# Patient Record
Sex: Male | Born: 1970
Health system: Southern US, Community
[De-identification: ages and names within clinical notes are randomized; demographics above are authoritative.]

## PROBLEM LIST (undated history)

## (undated) DIAGNOSIS — E785 Hyperlipidemia, unspecified: Secondary | ICD-10-CM

## (undated) DIAGNOSIS — N529 Male erectile dysfunction, unspecified: Secondary | ICD-10-CM

## (undated) DIAGNOSIS — T7840XA Allergy, unspecified, initial encounter: Secondary | ICD-10-CM

## (undated) HISTORY — PX: WISDOM TOOTH EXTRACTION: SHX21

## (undated) HISTORY — DX: Allergy, unspecified, initial encounter: T78.40XA

## (undated) HISTORY — PX: TONSILLECTOMY: SHX5217

## (undated) HISTORY — DX: Male erectile dysfunction, unspecified: N52.9

## (undated) HISTORY — DX: Hyperlipidemia, unspecified: E78.5

---

## 2003-04-13 ENCOUNTER — Encounter: Payer: Self-pay | Admitting: Internal Medicine

## 2004-04-25 ENCOUNTER — Ambulatory Visit: Payer: Self-pay | Admitting: Internal Medicine

## 2005-11-06 ENCOUNTER — Ambulatory Visit: Payer: Self-pay | Admitting: Internal Medicine

## 2006-04-03 ENCOUNTER — Ambulatory Visit: Payer: Self-pay | Admitting: Internal Medicine

## 2006-04-03 LAB — CONVERTED CEMR LAB
ALT: 32 units/L (ref 0–40)
Cholesterol: 255 mg/dL (ref 0–200)
HDL: 44.3 mg/dL (ref >39.0)
Triglyceride fasting, serum: 85 mg/dL (ref 0–149)
VLDL: 17 mg/dL (ref 0–40)

## 2006-04-14 ENCOUNTER — Ambulatory Visit: Payer: Self-pay | Admitting: Internal Medicine

## 2006-10-29 ENCOUNTER — Telehealth: Payer: Self-pay | Admitting: Internal Medicine

## 2007-01-01 ENCOUNTER — Ambulatory Visit: Payer: Self-pay | Admitting: Internal Medicine

## 2007-02-16 DIAGNOSIS — Z9189 Other specified personal risk factors, not elsewhere classified: Secondary | ICD-10-CM | POA: Insufficient documentation

## 2007-03-22 ENCOUNTER — Ambulatory Visit: Payer: Self-pay | Admitting: Internal Medicine

## 2007-03-22 DIAGNOSIS — I498 Other specified cardiac arrhythmias: Secondary | ICD-10-CM | POA: Insufficient documentation

## 2007-06-15 ENCOUNTER — Ambulatory Visit: Payer: Self-pay | Admitting: Internal Medicine

## 2007-06-23 ENCOUNTER — Ambulatory Visit: Payer: Self-pay | Admitting: Internal Medicine

## 2007-06-23 DIAGNOSIS — E785 Hyperlipidemia, unspecified: Secondary | ICD-10-CM | POA: Insufficient documentation

## 2007-06-24 ENCOUNTER — Encounter (INDEPENDENT_AMBULATORY_CARE_PROVIDER_SITE_OTHER): Payer: Self-pay | Admitting: *Deleted

## 2007-06-24 ENCOUNTER — Telehealth (INDEPENDENT_AMBULATORY_CARE_PROVIDER_SITE_OTHER): Payer: Self-pay | Admitting: *Deleted

## 2007-09-27 ENCOUNTER — Ambulatory Visit: Payer: Self-pay | Admitting: Internal Medicine

## 2007-10-08 ENCOUNTER — Encounter (INDEPENDENT_AMBULATORY_CARE_PROVIDER_SITE_OTHER): Payer: Self-pay | Admitting: *Deleted

## 2007-10-08 LAB — CONVERTED CEMR LAB
AST: 24 units/L (ref 0–37)
Cholesterol: 161 mg/dL (ref 0–200)
HDL: 42.8 mg/dL (ref >39.0)
Triglycerides: 80 mg/dL (ref 0–149)
VLDL: 16 mg/dL (ref 0–40)

## 2007-10-18 ENCOUNTER — Telehealth (INDEPENDENT_AMBULATORY_CARE_PROVIDER_SITE_OTHER): Payer: Self-pay | Admitting: *Deleted

## 2008-06-05 ENCOUNTER — Ambulatory Visit: Payer: Self-pay | Admitting: Internal Medicine

## 2008-06-05 DIAGNOSIS — M25519 Pain in unspecified shoulder: Secondary | ICD-10-CM | POA: Insufficient documentation

## 2008-06-12 ENCOUNTER — Encounter: Payer: Self-pay | Admitting: Internal Medicine

## 2008-07-25 ENCOUNTER — Telehealth (INDEPENDENT_AMBULATORY_CARE_PROVIDER_SITE_OTHER): Payer: Self-pay | Admitting: *Deleted

## 2008-12-07 ENCOUNTER — Ambulatory Visit: Payer: Self-pay | Admitting: Internal Medicine

## 2008-12-10 LAB — CONVERTED CEMR LAB
HDL: 49.9 mg/dL (ref >39.00)
LDL Cholesterol: 132 mg/dL — ABNORMAL HIGH (ref 0–99)
Total CHOL/HDL Ratio: 4
Triglycerides: 71 mg/dL (ref 0.0–149.0)
VLDL: 14.2 mg/dL (ref 0.0–40.0)

## 2008-12-11 ENCOUNTER — Encounter (INDEPENDENT_AMBULATORY_CARE_PROVIDER_SITE_OTHER): Payer: Self-pay | Admitting: *Deleted

## 2008-12-12 ENCOUNTER — Ambulatory Visit: Payer: Self-pay | Admitting: Internal Medicine

## 2008-12-12 DIAGNOSIS — R739 Hyperglycemia, unspecified: Secondary | ICD-10-CM | POA: Insufficient documentation

## 2008-12-12 LAB — CONVERTED CEMR LAB: LDL Goal: 140 mg/dL

## 2009-07-03 ENCOUNTER — Telehealth (INDEPENDENT_AMBULATORY_CARE_PROVIDER_SITE_OTHER): Payer: Self-pay | Admitting: *Deleted

## 2009-09-13 ENCOUNTER — Ambulatory Visit: Payer: Self-pay | Admitting: Internal Medicine

## 2009-09-13 DIAGNOSIS — J309 Allergic rhinitis, unspecified: Secondary | ICD-10-CM | POA: Insufficient documentation

## 2009-09-13 DIAGNOSIS — N529 Male erectile dysfunction, unspecified: Secondary | ICD-10-CM | POA: Insufficient documentation

## 2009-09-13 DIAGNOSIS — F528 Other sexual dysfunction not due to a substance or known physiological condition: Secondary | ICD-10-CM | POA: Insufficient documentation

## 2009-09-20 ENCOUNTER — Telehealth (INDEPENDENT_AMBULATORY_CARE_PROVIDER_SITE_OTHER): Payer: Self-pay | Admitting: *Deleted

## 2009-09-24 ENCOUNTER — Ambulatory Visit: Payer: Self-pay | Admitting: Internal Medicine

## 2009-10-03 ENCOUNTER — Telehealth (INDEPENDENT_AMBULATORY_CARE_PROVIDER_SITE_OTHER): Payer: Self-pay | Admitting: *Deleted

## 2009-10-16 ENCOUNTER — Telehealth (INDEPENDENT_AMBULATORY_CARE_PROVIDER_SITE_OTHER): Payer: Self-pay | Admitting: *Deleted

## 2009-12-11 ENCOUNTER — Telehealth (INDEPENDENT_AMBULATORY_CARE_PROVIDER_SITE_OTHER): Payer: Self-pay | Admitting: *Deleted

## 2010-02-23 LAB — CONVERTED CEMR LAB
Sex Hormone Binding: 21 nmol/L (ref 13–71)
Testosterone Free: 62.5 pg/mL (ref 47.0–244.0)

## 2010-04-10 ENCOUNTER — Ambulatory Visit: Admit: 2010-04-10 | Payer: Self-pay | Admitting: Internal Medicine

## 2010-04-28 LAB — CONVERTED CEMR LAB
AST: 20 units/L (ref 0–37)
AST: 25 units/L (ref 0–37)
Alkaline Phosphatase: 62 units/L (ref 39–117)
Alkaline Phosphatase: 62 units/L (ref 39–117)
BUN: 19 mg/dL (ref 6–23)
Basophils Absolute: 0 10*3/uL (ref 0.0–0.1)
Bilirubin, Direct: 0.1 mg/dL (ref 0.0–0.3)
Calcium: 9.6 mg/dL (ref 8.4–10.5)
Chloride: 107 meq/L (ref 96–112)
Cholesterol, target level: 200 mg/dL
Cholesterol: 218 mg/dL (ref 0–200)
Eosinophils Absolute: 0.2 10*3/uL (ref 0.0–0.6)
GFR calc Af Amer: 121 mL/min
GFR calc non Af Amer: 100 mL/min
GFR calc non Af Amer: 93.9 mL/min (ref >60)
Glucose, Bld: 82 mg/dL (ref 70–99)
HCT: 42.2 % (ref 39.0–52.0)
HDL: 39.9 mg/dL (ref >39.0)
HDL: 47.5 mg/dL (ref >39.00)
Hemoglobin: 15.2 g/dL (ref 13.0–17.0)
LDL Cholesterol: 122 mg/dL — ABNORMAL HIGH (ref 0–99)
LDL Goal: 160 mg/dL
Lymphocytes Relative: 32.6 % (ref 12.0–46.0)
MCV: 84.5 fL (ref 78.0–100.0)
Monocytes Absolute: 0.5 10*3/uL (ref 0.2–0.7)
Monocytes Relative: 8.4 % (ref 3.0–12.0)
Neutrophils Relative %: 58.9 % (ref 43.0–77.0)
Platelets: 219 10*3/uL (ref 150.0–400.0)
Platelets: 251 10*3/uL (ref 150–400)
Potassium: 3.8 meq/L (ref 3.5–5.1)
RDW: 12.9 % (ref 11.5–14.6)
RDW: 14 % (ref 11.5–14.6)
Sodium: 141 meq/L (ref 135–145)
Sodium: 142 meq/L (ref 135–145)
TSH: 2.28 microintl units/mL (ref 0.35–5.50)
Total Bilirubin: 0.5 mg/dL (ref 0.3–1.2)
Total Bilirubin: 0.7 mg/dL (ref 0.3–1.2)
Total CHOL/HDL Ratio: 5.5
Triglycerides: 58 mg/dL (ref 0–149)
VLDL: 17.8 mg/dL (ref 0.0–40.0)
WBC: 5.3 10*3/uL (ref 4.5–10.5)

## 2010-04-30 NOTE — Progress Notes (Signed)
Summary: Med too expensive  Phone Note Call from Patient Call back at Work Phone [phone] Call back at EXT: 24   Caller: Patient Summary of Call: Message left on Triage VM: Patient received rx  and it was too expensive please call to discuss  Shonna Chock CMA  October 16, 2009 10:44 AM   Follow-up for Phone Call        Left message on patient's personal VM at work informing him to check his formulary and call us with the name of covered med and we will send it to the pharmacy or mail rx to him. Follow-up by: Shonna Chock CMA,  October 16, 2009 5:12 PM  Additional Follow-up for Phone Call Additional follow up Details #1::        Called patient as a follow-up and informed him that when he  gets formulary to contact us and we will select a different med Additional Follow-up by: Shonna Chock CMA,  October 18, 2009 1:20 PM    Additional Follow-up for Phone Call Additional follow up Details #2::    Patient called back an dleft a message stating that he called insurance and they said there is not a generic of any med avaliable that would be less expensive. Patient would like for Dr.Hopper to advise on another med, androgel was $300, patient would like a call on cell if after 3pm [phone]  Additional Follow-up for Phone Call Additional follow up Details #3:: Details for Additional Follow-up Action Taken: Since his insurance will not provide supplement @  reasonable copay ; I recommend Urology consultation . The specialist may have different option or may be able to circumvent this insursnce "roadblock"   I spoke with patient and he said he would like to hold off on doing anything right now. Patient said his company will change insurance companies in 6 months and may be at that time he will re-address this situation./[facility] CMA  October 19, 2009 3:08 PM

## 2010-04-30 NOTE — Progress Notes (Signed)
Summary: PRAVASTITIN REFILL, NEEDS CPX LABS  Phone Note Call from Patient Call back at Home Phone [phone]   Caller: Patient Summary of Call: NEEDS REFILL FOR PRAVASTATIN---PLEASE MAIL TO HIM  (SAYS HE LOST THE LAST ONE SENT TO HIM)  HAD CPX LABS FOR 09/16/2010---WHAT DOES DR HOPPER WANT?? Initial call taken by: Jerolyn Shin,  December 11, 2009 2:02 PM  Follow-up for Phone Call        pls advise on CPX lab order..............Marland KitchenFelecia Deloach CMA  December 11, 2009 3:38 PM   rx mailed to pt  Additional Follow-up for Phone Call Additional follow up Details #1::        he had CPX in 08/2009; refill Pravastatin until 06 /2012 please Additional Follow-up by: Marga Melnick MD,  December 11, 2009 5:45 PM    Additional Follow-up for Phone Call Additional follow up Details #2::    TLB-Lipid Panel (80061-LIPID) TLB-BMP (Basic Metabolic Panel-BMET) (80048-METABOL) TLB-CBC Platelet - w/Differential (85025-CBCD) TLB-Hepatic/Liver Function Pnl (80076-HEPATIC) TLB-TSH (Thyroid Stimulating Hormone) (84443-TSH) TLB-Testosterone, Total (84403-TESTO) Stool Cards/Udip  v70.0/272.4/995.20  Additional Follow-up for Phone Call Additional follow up Details #3:: Details for Additional Follow-up Action Taken: ENTERED LAB INFO FOR 09/16/2010 Additional Follow-up by: Jerolyn Shin,  December 17, 2009 3:58 PM  Prescriptions: PRAVASTATIN SODIUM 40 MG  TABS (PRAVASTATIN SODIUM) Take 1 tab at bedtime  #90 x 3   Entered by:   Jeremy Johann CMA   Authorized by:   Marga Melnick MD   Signed by:   Jeremy Johann CMA on 12/11/2009   Method used:   Reprint   RxID:   [id number]

## 2010-04-30 NOTE — Progress Notes (Signed)
Summary: Mis-Placed RX  Phone Note Call from Patient Call back at Work Phone [phone] Call back at EXT 24   Summary of Call: Message left on VM: Patient mis-placed chlosterol med RX, would like a call to futher discuss   I spoke with patient and he requested that I mail him Rx's Initial call taken by: Shonna Chock,  September 20, 2009 2:49 PM    Prescriptions: PRAVASTATIN SODIUM 40 MG  TABS (PRAVASTATIN SODIUM) Take 1 tab at bedtime  #90 x 3   Entered by:   Shonna Chock   Authorized by:   Marga Melnick MD   Signed by:   Shonna Chock on 09/20/2009   Method used:   Print then Mail to Patient   RxID:   [id number]

## 2010-04-30 NOTE — Progress Notes (Signed)
Summary: Request to discuss labs   Phone Note Call from Patient Call back at Home Phone [phone] Call back at Work Phone [phone] Call back at EXT: 24   Caller: Patient Summary of Call: Message left on VM: Patient would like to discuss labs    I called patient back and left message on his personal VM to return call to discuss labs or to select (0) to schedule appointment to futher discuss. Initial call taken by: Shonna Chock,  October 03, 2009 1:33 PM  Follow-up for Phone Call        Patient left message on VM: Returning my call  Left message on VM informing patient to please call and select option (0) and have the administrative staff page me so that I can answer his call. We keep missing each other's call Follow-up by: Shonna Chock,  October 03, 2009 5:04 PM  Additional Follow-up for Phone Call Additional follow up Details #1::        Patient ok to try: Testosterone trial for 6 months (see lab append). Patient would like RX sent to [facility] (Generic if avaliable/ Samples if avaliable)   Dr.Hopper please advise Additional Follow-up by: Shonna Chock,  October 04, 2009 11:30 AM    Additional Follow-up for Phone Call Additional follow up Details #2::    I want to review the latest State of the Art paper  on Testosterone relacement as to best option  before Rxing this as free level was normal. I'll get back to him  by [facility] @ latest. Follow-up by: Marga Melnick MD,  October 04, 2009 12:51 PM  Additional Follow-up for Phone Call Additional follow up Details #3:: Details for Additional Follow-up Action Taken: I left message on patient's work number informing patient of Dr.Hopper's response. Patient to call if any questions. Additional Follow-up by: Shonna Chock,  October 04, 2009 1:32 PM

## 2010-04-30 NOTE — Assessment & Plan Note (Signed)
Summary: COMPLETE PHYSICAL,FASTING/RH......   Vital Signs:  Patient profile:   40 year old male Height:      70.5 inches Weight:      219 pounds BMI:     31.09 Temp:     99.1 degrees F oral Pulse rate:   72 / minute Resp:     14 per minute BP sitting:   140 / 88  (right arm) Cuff size:   large  Vitals Entered By: Shonna Chock (September 13, 2009 9:50 AM)  CC: CPX with fasting labs , General Medical Evaluation Comments REVIEWED MED LIST, PATIENT AGREED DOSE AND INSTRUCTION CORRECT    CC:  CPX with fasting labs  and General Medical Evaluation.  History of Present Illness: David Schneider is here for a physical; he has some allergy symptoms.  Preventive Screening-Counseling & Management  Caffeine-Diet-Exercise     Does Patient Exercise: yes  Allergies: 1)  ! Joyce Copa D  Past History:  Past Medical History: HYPERLIPIDEMIA (ICD-272.2): LDL goal = < 140 based on NMR CHEST PAIN, ATYPICAL,PMH of  (ICD-V15.89) ED Allergic rhinitis  Past Surgical History: Tonsillectomy Wisdom Teeth Extraction  Family History: Father: HTN,  hyperlipidemia Mother:CAD pre 19, stents (2006), DM, hyperlipidemia Siblings: brother:DM MGM:  DM, ?CVA, MI in 42's MGF:  Throat cancer (hx. of ETOH)  Social History: Never Smoked Alcohol use-yes occasionally Occupation:Production Production designer, theatre/television/film Married Regular exercise-yes: walking 30 min 2X/week  Review of Systems  The patient denies anorexia, fever, weight loss, weight gain, vision loss, decreased hearing, hoarseness, chest pain, syncope, dyspnea on exertion, peripheral edema, prolonged cough, headaches, hemoptysis, abdominal pain, melena, hematochezia, severe indigestion/heartburn, suspicious skin lesions, depression, unusual weight change, abnormal bleeding, enlarged lymph nodes, and angioedema.   GU:  Denies decreased libido, discharge, dysuria, hematuria, nocturia, and urinary hesitancy. MS:  Denies joint pain, joint redness, joint swelling, low back pain, mid  back pain, muscle weakness, and thoracic pain. Allergy:  Complains of seasonal allergies; denies hives or rash, itching eyes, and sneezing; Mainly head "stuffiness".  Physical Exam  General:  well-nourished; alert,appropriate and cooperative throughout examination Head:  Normocephalic and atraumatic without obvious abnormalities. No apparent alopecia  Eyes:  No corneal or conjunctival inflammation noted. Perrla. Funduscopic exam benign, without hemorrhages, exudates or papilledema. Ears:  External ear exam shows no significant lesions or deformities.  Otoscopic examination reveals clear canals, tympanic membranes are intact bilaterally without bulging, retraction, inflammation or discharge. Hearing is grossly normal bilaterally. Nose:  External nasal examination shows no deformity or inflammation. Nasal mucosa are pink and moist without lesions or exudates. Mouth:  Oral mucosa and oropharynx without lesions or exudates.  Teeth in good repair. Neck:  No deformities, masses, or tenderness noted. Lungs:  Normal respiratory effort, chest expands symmetrically. Lungs are clear to auscultation, no crackles or wheezes. Heart:  Normal rate and regular rhythm. S1 and S2 normal without gallop, murmur, click, rub . S4 Abdomen:  Bowel sounds positive,abdomen soft and non-tender without masses, organomegaly or hernias noted. Rectal:  No external abnormalities noted. Normal sphincter tone. No rectal masses or tenderness. Genitalia:  Testes bilaterally descended without nodularity, tenderness or masses. No scrotal masses or lesions. No penis lesions or urethral discharge. Prostate:  Prostate gland firm and smooth, ULN  enlargement w/o nodularity, tenderness, mass, asymmetry or induration. Msk:  No deformity or scoliosis noted of thoracic or lumbar spine.   Pulses:  R and L carotid,radial,dorsalis pedis and posterior tibial pulses are full and equal bilaterally Extremities:  No clubbing, cyanosis, edema, or  deformity noted with normal full range of motion of all joints.   Neurologic:  alert & oriented X3 and DTRs symmetrical and normal.   Skin:  Intact without suspicious lesions or rashes. Tanned Cervical Nodes:  No lymphadenopathy noted Axillary Nodes:  No palpable lymphadenopathy Inguinal Nodes:  No significant adenopathy Psych:  memory intact for recent and remote, normally interactive, and good eye contact.     Impression & Recommendations:  Problem # 1:  ROUTINE GENERAL MEDICAL EXAM@HEALTH  CARE FACL (ICD-V70.0)  Orders: EKG w/ Interpretation (93000) Venipuncture (54098) TLB-Lipid Panel (80061-LIPID) TLB-BMP (Basic Metabolic Panel-BMET) (80048-METABOL) TLB-CBC Platelet - w/Differential (85025-CBCD) TLB-Hepatic/Liver Function Pnl (80076-HEPATIC) TLB-TSH (Thyroid Stimulating Hormone) (84443-TSH) TLB-Testosterone, Total (84403-TESTO)  Problem # 2:  HYPERLIPIDEMIA (ICD-272.4)  His updated medication list for this problem includes:    Pravastatin Sodium 40 Mg Tabs (Pravastatin sodium) .Marland Kitchen... Take 1 tab at bedtime  Orders: EKG w/ Interpretation (93000) Venipuncture (11914) TLB-Lipid Panel (80061-LIPID)  Problem # 3:  ALLERGIC RHINITIS (ICD-477.9)  Mild  Orders: Venipuncture (78295)  Problem # 4:  ERECTILE DYSFUNCTION, NON-ORGANIC, MILD (ICD-302.72)  His updated medication list for this problem includes:    Cialis 20 Mg Tabs (Tadalafil) .Marland Kitchen... Take 1 every 3 days as needed  Orders: Venipuncture (62130) TLB-Testosterone, Total (84403-TESTO)  Complete Medication List: 1)  Cialis 20 Mg Tabs (Tadalafil) .... Take 1 every 3 days as needed 2)  Aspirin Ec 81 Mg Tbec (Aspirin) 3)  Pravastatin Sodium 40 Mg Tabs (Pravastatin sodium) .... Take 1 tab at bedtime  Other Orders: Tdap => 42yrs IM (86578) Admin 1st Vaccine (46962)  Patient Instructions: 1)  It is important that you exercise regularly at least 20 minutes 5 times a week. If you develop chest pain, have severe  difficulty breathing, or feel very tired , stop exercising immediately and seek medical attention. 2)  Take an Aspirin every day. Prescriptions: PRAVASTATIN SODIUM 40 MG  TABS (PRAVASTATIN SODIUM) Take 1 tab at bedtime  #90 x 3   Entered and Authorized by:   Marga Melnick MD   Signed by:   Marga Melnick MD on 09/13/2009   Method used:   Print then Give to Patient   RxID:   [id number]    Immunizations Administered:  Tetanus Vaccine:    Vaccine Type: Tdap    Site: right deltoid    Mfr: GlaxoSmithKline    Dose: 0.5 ml    Route: IM    Given by: Chrae Malloy    Exp. Date: 06/23/2011    Lot #: [id number]    VIS given: 02/16/07 version given September 13, 2009.   Appended Document: COMPLETE PHYSICAL,FASTING/RH......  Laboratory Results   Urine Tests   Date/Time Reported: September 13, 2009 12:43 PM   Routine Urinalysis   Color: yellow Appearance: Clear Glucose: negative   (Normal Range: Negative) Bilirubin: negative   (Normal Range: Negative) Ketone: negative   (Normal Range: Negative) Spec. Gravity: 1.020   (Normal Range: 1.003-1.035) Blood: negative   (Normal Range: Negative) pH: 6.5   (Normal Range: 5.0-8.0) Protein: negative   (Normal Range: Negative) Urobilinogen: negative   (Normal Range: 0-1) Nitrite: negative   (Normal Range: Negative) Leukocyte Esterace: negative   (Normal Range: Negative)    Comments: Floydene Flock  September 13, 2009 12:43 PM

## 2010-04-30 NOTE — Progress Notes (Signed)
Summary: Need refills sent to new pharmacy  Phone Note Refill Request Message from:  Patient on July 03, 2009 2:46 PM  Refills Requested: Medication #1:  CIALIS 20 MG  TABS TAKE 1 EVERY 3 DAYS as needed Patient filled meds by mail but insurance plan has changed.  Need it sent to [facility] on [address]  Please call when completed. [phone]   Method Requested: Telephone to Pharmacy Next Appointment Scheduled: September 13, 2009 Initial call taken by: Barnie Mort,  July 03, 2009 2:47 PM  Follow-up for Phone Call        Per patient request please add refills to rx. I called the pharmacy and left message to add 4 additional refills. Follow-up by: Shonna Chock,  July 04, 2009 9:55 AM    Prescriptions: CIALIS 20 MG  TABS (TADALAFIL) TAKE 1 EVERY 3 DAYS as needed  #6 x 0   Entered by:   Shonna Chock   Authorized by:   Marga Melnick MD   Signed by:   Shonna Chock on 07/03/2009   Method used:   Electronically to        [facility] [phone]* (retail)       [address]       [place], [place]  [address]       Ph: [phone]       Fax: [phone]   RxID:   [id number]

## 2010-05-08 ENCOUNTER — Telehealth (INDEPENDENT_AMBULATORY_CARE_PROVIDER_SITE_OTHER): Payer: Self-pay | Admitting: *Deleted

## 2010-05-16 NOTE — Progress Notes (Signed)
Summary: Med Refill  Phone Note Call from Patient Call back at [phone]   Caller: Patient Summary of Call: Message left on voicemail: Patient would like a New rx for Cialis and he has to find a new pharmacy due to [facility] shutting down.   I called patient and he indicated he would like rx mailed to him and he will contact his insurance company to see what pharmacy they cover. Addess verified and rx to be mailed./Chrae [facility] CMA  May 08, 2010 4:14 PM     Prescriptions: CIALIS 20 MG  TABS (TADALAFIL) TAKE 1 EVERY 3 DAYS as needed  #6 x 4   Entered by:   Shonna Chock CMA   Authorized by:   Marga Melnick MD   Signed by:   Shonna Chock CMA on 05/08/2010   Method used:   Print then Mail to Patient   RxID:   [id number]

## 2010-09-11 ENCOUNTER — Other Ambulatory Visit: Payer: Self-pay | Admitting: Internal Medicine

## 2010-09-11 DIAGNOSIS — Z0000 Encounter for general adult medical examination without abnormal findings: Secondary | ICD-10-CM

## 2010-09-11 DIAGNOSIS — E785 Hyperlipidemia, unspecified: Secondary | ICD-10-CM

## 2010-09-11 DIAGNOSIS — T887XXA Unspecified adverse effect of drug or medicament, initial encounter: Secondary | ICD-10-CM

## 2010-09-12 ENCOUNTER — Other Ambulatory Visit (INDEPENDENT_AMBULATORY_CARE_PROVIDER_SITE_OTHER): Payer: BC Managed Care – PPO

## 2010-09-12 DIAGNOSIS — T887XXA Unspecified adverse effect of drug or medicament, initial encounter: Secondary | ICD-10-CM

## 2010-09-12 DIAGNOSIS — Z0000 Encounter for general adult medical examination without abnormal findings: Secondary | ICD-10-CM

## 2010-09-12 DIAGNOSIS — E785 Hyperlipidemia, unspecified: Secondary | ICD-10-CM

## 2010-09-12 LAB — CBC WITH DIFFERENTIAL/PLATELET
Basophils Absolute: 0 10*3/uL (ref 0.0–0.1)
HCT: 41.2 % (ref 39.0–52.0)
Hemoglobin: 14.1 g/dL (ref 13.0–17.0)
Lymphs Abs: 1.6 10*3/uL (ref 0.7–4.0)
Monocytes Relative: 9 % (ref 3.0–12.0)
Neutro Abs: 3 10*3/uL (ref 1.4–7.7)
RDW: 14.5 % (ref 11.5–14.6)

## 2010-09-12 LAB — BASIC METABOLIC PANEL
CO2: 29 mEq/L (ref 19–32)
GFR: 88.02 mL/min (ref >60.00)
Glucose, Bld: 100 mg/dL — ABNORMAL HIGH (ref 70–99)
Potassium: 4 mEq/L (ref 3.5–5.1)
Sodium: 142 mEq/L (ref 135–145)

## 2010-09-12 LAB — LIPID PANEL
Cholesterol: 183 mg/dL (ref 0–200)
LDL Cholesterol: 124 mg/dL — ABNORMAL HIGH (ref 0–99)

## 2010-09-12 LAB — HEPATIC FUNCTION PANEL
ALT: 20 U/L (ref 0–53)
AST: 23 U/L (ref 0–37)
Albumin: 4.2 g/dL (ref 3.5–5.2)
Total Protein: 7.2 g/dL (ref 6.0–8.3)

## 2010-09-12 LAB — TSH: TSH: 2.4 u[IU]/mL (ref 0.35–5.50)

## 2010-09-12 NOTE — Progress Notes (Signed)
Labs only

## 2010-09-16 ENCOUNTER — Other Ambulatory Visit: Payer: Self-pay

## 2010-09-21 ENCOUNTER — Encounter: Payer: Self-pay | Admitting: Internal Medicine

## 2010-09-24 ENCOUNTER — Encounter: Payer: Self-pay | Admitting: Internal Medicine

## 2010-09-24 ENCOUNTER — Ambulatory Visit (INDEPENDENT_AMBULATORY_CARE_PROVIDER_SITE_OTHER): Payer: BC Managed Care – PPO | Admitting: Internal Medicine

## 2010-09-24 VITALS — BP 120/82 | HR 95 | Ht 71.0 in | Wt 220.0 lb

## 2010-09-24 DIAGNOSIS — E785 Hyperlipidemia, unspecified: Secondary | ICD-10-CM

## 2010-09-24 DIAGNOSIS — Z0000 Encounter for general adult medical examination without abnormal findings: Secondary | ICD-10-CM

## 2010-09-24 MED ORDER — PRAVASTATIN SODIUM 40 MG PO TABS: 40.0000 mg | ORAL_TABLET | Freq: Every day | ORAL | Status: DC

## 2010-09-24 MED ORDER — TADALAFIL 20 MG PO TABS: 20.0000 mg | ORAL_TABLET | Freq: Every day | ORAL | Status: DC | PRN

## 2010-09-24 NOTE — Progress Notes (Signed)
  Subjective:    Patient ID: David Schneider, male    DOB: 08-13-1970, 40 y.o.   MRN: [id number]  HPI  David Schneider  is here for a physical he has no acute issues.      Review of Systems Patient reports no  vision/ hearing changes,anorexia, weight change, fever ,adenopathy, persistant / recurrent hoarseness, swallowing issues, chest pain,palpitations, edema,persistant / recurrent cough, hemoptysis, dyspnea(rest, exertional, paroxysmal nocturnal), gastrointestinal  bleeding (melena, rectal bleeding), abdominal pain, excessive heart burn, GU symptoms( dysuria, hematuria, pyuria, voiding/incontinence  issues) syncope, focal weakness, memory loss,numbness , skin/hair/nail changes,depression, anxiety, abnormal bruising/bleeding, or musculoskeletal symptoms/signs.  Intermittent tingling of UE in am.    Objective:   Physical Exam Gen.: Healthy and well-nourished in appearance. Alert, appropriate and cooperative throughout exam. Head: Normocephalic without obvious abnormalities;  no alopecia  Eyes: No corneal or conjunctival inflammation noted. Pupils equal round reactive to light and accommodation. Fundal exam is benign without hemorrhages, exudate, papilledema. Extraocular motion intact. Vision grossly normal. Ears: External  ear exam reveals no significant lesions or deformities. Canals clear .TMs normal. Hearing is grossly normal bilaterally. Nose: External nasal exam reveals no deformity or inflammation. Nasal mucosa are pink and moist. No lesions or exudates noted. Septum not significantly deviated Mouth: Oral mucosa and oropharynx reveal no lesions or exudates. Teeth in good repair. Neck: No deformities, masses, or tenderness noted. Range of motion &. Thyroid normal. Lungs: Normal respiratory effort; chest expands symmetrically. Lungs are clear to auscultation without rales, wheezes, or increased work of breathing. Heart: Normal rate and rhythm. Normal S1 and S2. No gallop, click, or rub. No  murmur. Abdomen: Bowel sounds normal; abdomen soft and nontender. No masses, organomegaly or hernias noted. Genitalia/DRE: varicocele on L ,otherwise normal.                                                                                     Musculoskeletal/extremities: No deformity or scoliosis noted of  the thoracic or lumbar spine. No clubbing, cyanosis, edema, or deformity noted. Range of motion  normal .Tone & strength  normal.Joints normal. Nail health  good. Vascular: Carotid, radial artery, dorsalis pedis and  posterior tibial pulses are full and equal. No bruits present. Neurologic: Alert and oriented x3. Deep tendon reflexes symmetrical and normal.          Skin: Intact without suspicious lesions or rashes. Lymph: No cervical, axillary, or inguinal lymphadenopathy present. Psych: Mood and affect are normal. Normally interactive                                                                                         Assessment & Plan:  #1 comprehensive physical exam; no acute findings #2 see Problem List with Assessments & Recommendations Plan: see Orders

## 2010-09-24 NOTE — Patient Instructions (Signed)
Preventive Health Care: Exercise at least 30-45 minutes a day,  3-4 days a week.  Eat a low-fat diet with lots of fruits and vegetables, up to 7-9 servings per day. Consume less than 40 grams of sugar per day from foods & drinks with High Fructose Corn Sugar as #2,3 or # 4 on label. Health Care Power of Attorney & Living Will. Complete if not in place ; these place you in charge of your health care decisions. Consider "cervical "pillow to prevent tingling due to nerve compression .

## 2010-10-08 ENCOUNTER — Ambulatory Visit (INDEPENDENT_AMBULATORY_CARE_PROVIDER_SITE_OTHER): Payer: BC Managed Care – PPO | Admitting: Internal Medicine

## 2010-10-08 ENCOUNTER — Encounter: Payer: Self-pay | Admitting: Internal Medicine

## 2010-10-08 VITALS — BP 114/80 | HR 64 | Temp 98.7°F | Wt 219.8 lb

## 2010-10-08 DIAGNOSIS — M545 Low back pain, unspecified: Secondary | ICD-10-CM

## 2010-10-08 MED ORDER — HYDROCODONE-ACETAMINOPHEN 7.5-500 MG PO TABS: 1.0000 | ORAL_TABLET | ORAL | Status: AC | PRN

## 2010-10-08 MED ORDER — CYCLOBENZAPRINE HCL 5 MG PO TABS: 5.0000 mg | ORAL_TABLET | ORAL | Status: AC

## 2010-10-08 NOTE — Progress Notes (Signed)
  Subjective:    Patient ID: David Schneider, male    DOB: [birth year], 40 y.o.   MRN: [id number]  HPI BACK  PAIN Location: R LS area   Onset: 16 days ago after doubles tennis   Severity: up to 8 Pain is described as: throbbing  Worse with: leaning to R   Better with: minimal benefit with hot shower & massage   Pain radiates to: R anterior hip in past few days   Impaired range of motion: yes  History of repetitive motion:  no  History of overuse or hyperextension:  no  History of trauma:  no   Past history of similar problem:  yes, LS  & into legs @ age 49   Symptoms Numbness/tingling:  no  Weakness:  no  Red Flags Fever:  no  Bowel/bladder dysfunction:  no      Review of Systems No GU symptoms     Objective:   Physical Exam Gen.: Healthy and well-nourished in appearance. Alert, appropriate and cooperative throughout exam but uncomfortable.                                                                                 Musculoskeletal/extremities: No deformity or scoliosis noted of  the thoracic or lumbar spine. There is tenderness to percussion over the right lumbosacral area. He is able to lie down and sit up without help. There is negative straight leg raising bilaterally. No clubbing, cyanosis, edema, or deformity noted. Range of motion  normal .Tone & strength  normal.Joints normal. Nail health  good. Neurologic: Alert and oriented x3. Deep tendon reflexes symmetrical and normal . Gait (toe and heel) is normal.         Skin: Intact without suspicious lesions or rashes. Psych: Mood and affect are normal. Normally interactive                                                                                         Assessment & Plan:  #1 classic low back syndrome with muscular spasm @ L 4 level  Plan: See orders

## 2010-10-08 NOTE — Patient Instructions (Signed)
Stretching exercises for the low back as discussed. The muscle relaxant to be taken 1-2 at bedtime as needed.

## 2010-12-30 ENCOUNTER — Other Ambulatory Visit: Payer: Self-pay | Admitting: Internal Medicine

## 2011-06-26 ENCOUNTER — Other Ambulatory Visit: Payer: Self-pay | Admitting: Internal Medicine

## 2011-06-30 NOTE — Telephone Encounter (Signed)
Rx sent 

## 2011-06-30 NOTE — Telephone Encounter (Signed)
#  6 , RX 3 

## 2011-06-30 NOTE — Telephone Encounter (Signed)
Last Filled 09-24-10 #10 5, last OV 10-08-10

## 2012-01-19 ENCOUNTER — Other Ambulatory Visit: Payer: Self-pay | Admitting: Internal Medicine

## 2012-01-19 NOTE — Telephone Encounter (Signed)
Lipid/Hep 272.4/995.20  

## 2012-01-21 ENCOUNTER — Other Ambulatory Visit: Payer: Self-pay | Admitting: Internal Medicine

## 2012-02-06 ENCOUNTER — Other Ambulatory Visit: Payer: Self-pay | Admitting: Internal Medicine

## 2012-02-06 MED ORDER — TADALAFIL 20 MG PO TABS: ORAL_TABLET | ORAL | Status: DC

## 2012-02-06 NOTE — Telephone Encounter (Signed)
Patient aware rx is ready for pick-up, patient informed he needs to schedule a CPX.

## 2012-02-06 NOTE — Telephone Encounter (Signed)
Pt called would like to get a refill for Cialis as he sends to [place]. Advised pt IF APPROVED he would have to pick up RX her and mail or fax himself. Pt wanted to if approved could RX be mailed to him versus picking it up? Please advise  CB# [phone]  Last Wrt 3.28.13 #6 wt/3-refills Last ov 7.10.12

## 2012-02-06 NOTE — Telephone Encounter (Signed)
#  10 OK X1 but additional refills require office visit to update medical history. Last seen 7/12

## 2012-02-06 NOTE — Telephone Encounter (Signed)
Dr.Hopper please advise  Last OV 10/08/2010

## 2012-03-23 ENCOUNTER — Other Ambulatory Visit: Payer: Self-pay | Admitting: Internal Medicine

## 2012-04-28 ENCOUNTER — Ambulatory Visit (INDEPENDENT_AMBULATORY_CARE_PROVIDER_SITE_OTHER): Payer: BC Managed Care – PPO | Admitting: Internal Medicine

## 2012-04-28 ENCOUNTER — Encounter: Payer: Self-pay | Admitting: Internal Medicine

## 2012-04-28 VITALS — BP 112/70 | HR 75 | Temp 98.5°F | Resp 12 | Ht 70.5 in | Wt 223.0 lb

## 2012-04-28 DIAGNOSIS — Z0000 Encounter for general adult medical examination without abnormal findings: Secondary | ICD-10-CM

## 2012-04-28 DIAGNOSIS — E785 Hyperlipidemia, unspecified: Secondary | ICD-10-CM

## 2012-04-28 MED ORDER — ALPRAZOLAM 0.25 MG PO TABS: ORAL_TABLET | ORAL | Status: DC

## 2012-04-28 MED ORDER — PRAVASTATIN SODIUM 40 MG PO TABS: 40.0000 mg | ORAL_TABLET | Freq: Every evening | ORAL | Status: DC | PRN

## 2012-04-28 MED ORDER — TADALAFIL 20 MG PO TABS: ORAL_TABLET | ORAL | Status: DC

## 2012-04-28 NOTE — Progress Notes (Signed)
  Subjective:    Patient ID: David Schneider, male    DOB: April 22, 1970, 42 y.o.   MRN: [id number]  HPI David Schneider is here for a physical;acute issues  Include work related stress.      Review of Systems   Onset:6 mos ago Anxiety:yes Depression:no Loss of interest (Anhedonia):no Panic attacks:no Insomnia:"OK" but awakens @ 4 am Anorexia: no Fatigue: no Neurologic signs/symptoms: no headache, numbness and tingling, weakness Endocrinologic signs and symptoms:no hoarseness, weight change, vision change, temperature intolerance, bowel changes, skin/hair,/nail change Treatment : 1/2 Xanax from friend effective Family history of anxiety in mother       Objective:   Physical Exam Gen.: well-nourished in appearance. Alert, appropriate and cooperative throughout exam.   Head: Normocephalic without obvious abnormalities; no alopecia  Eyes: No corneal or conjunctival inflammation noted. Pupils equal round reactive to light and accommodation. Fundal exam is benign without hemorrhages, exudate, papilledema. Extraocular motion intact. Vision grossly normal. Ears: External  ear exam reveals no significant lesions or deformities. Canals clear .TMs normal. Hearing is grossly normal bilaterally. Nose: External nasal exam reveals no deformity or inflammation. Nasal mucosa are pink and moist. No lesions or exudates noted.   Mouth: Oral mucosa and oropharynx reveal no lesions or exudates. Teeth in good repair. Neck: No deformities, masses, or tenderness noted. Range of motion & Thyroid normal. Lungs: Normal respiratory effort; chest expands symmetrically. Lungs are clear to auscultation without rales, wheezes, or increased work of breathing. Heart: Normal rate and rhythm. Normal S1 and S2. No gallop, click, or rub. S4 w/o murmur. Abdomen: Bowel sounds normal; abdomen soft and nontender. No masses, organomegaly or hernias noted. Genitalia: Genitalia normal except for left varices.External hemorrhoidal  tissues. Prostate is normal without enlargement, asymmetry, nodularity, or induration.                                    Musculoskeletal/extremities: No deformity or scoliosis noted of  the thoracic or lumbar spine. No clubbing, cyanosis, edema, or significant extremity  deformity noted. Range of motion normal .Tone & strength  normal.Joints normal . Nail health good. Able to lie down & sit up w/o help. Negative SLR bilaterally Vascular: Carotid, radial artery, dorsalis pedis and  posterior tibial pulses are full and equal. No bruits present. Neurologic: Alert and oriented x3. Deep tendon reflexes symmetrical and normal.  Skin: Intact without suspicious lesions or rashes. Lymph: No cervical, axillary, or inguinal lymphadenopathy present. Psych: Mood and affect are normal. Normally interactive                                                                                         Assessment & Plan:  #1 comprehensive physical exam; no acute findings #2 situational anxiety  Plan: see Orders  & Recommendations

## 2012-04-28 NOTE — Patient Instructions (Addendum)
To prevent palpitations or premature beats, avoid stimulants such as decongestants, diet pills, nicotine, or caffeine (coffee, tea, cola, or chocolate) to excess. EKG is normal but there are MINOR T changes in Lead III . These are normal variants; but this EKG should be available for comparison if  seen emergently.  Cardiovascular exercise, this can be as simple a program as walking, is recommended 30-45 minutes 3-4 times per week. If you're not exercising you should take 6-8 weeks to build up to this level. Consume less than 40 grams of sugar per day from foods & drinks with High Fructose Corn Sugar as #1,2,3 or # 4 on label. Health Care Power of Attorney & Living Will. Complete if not in place ; these place you in charge of your health care decisions.  If you activate My Chart; the results can be released to you as soon as they populate from the lab. If you choose not to use this program; the labs have to be reviewed, copied & mailed   causing a delay in getting the results to you.

## 2012-04-29 LAB — BASIC METABOLIC PANEL
CO2: 27 mEq/L (ref 19–32)
Chloride: 105 mEq/L (ref 96–112)
Sodium: 141 mEq/L (ref 135–145)

## 2012-04-29 LAB — CBC WITH DIFFERENTIAL/PLATELET
Basophils Relative: 0.3 % (ref 0.0–3.0)
Eosinophils Absolute: 0.2 10*3/uL (ref 0.0–0.7)
Eosinophils Relative: 2.3 % (ref 0.0–5.0)
Hemoglobin: 14.7 g/dL (ref 13.0–17.0)
Lymphocytes Relative: 24.3 % (ref 12.0–46.0)
MCHC: 33.4 g/dL (ref 30.0–36.0)
MCV: 84.6 fl (ref 78.0–100.0)
Monocytes Absolute: 0.6 10*3/uL (ref 0.1–1.0)
Neutro Abs: 5.1 10*3/uL (ref 1.4–7.7)
Neutrophils Relative %: 65.6 % (ref 43.0–77.0)
RBC: 5.2 Mil/uL (ref 4.22–5.81)
WBC: 7.7 10*3/uL (ref 4.5–10.5)

## 2012-04-29 LAB — HEPATIC FUNCTION PANEL
ALT: 31 U/L (ref 0–53)
Albumin: 4.4 g/dL (ref 3.5–5.2)
Alkaline Phosphatase: 58 U/L (ref 39–117)
Bilirubin, Direct: 0.1 mg/dL (ref 0.0–0.3)
Total Protein: 7.7 g/dL (ref 6.0–8.3)

## 2012-04-29 LAB — TSH: TSH: 2.32 u[IU]/mL (ref 0.35–5.50)

## 2012-04-29 LAB — LIPID PANEL
HDL: 48.3 mg/dL (ref >39.00)
Total CHOL/HDL Ratio: 4

## 2012-05-25 ENCOUNTER — Telehealth: Payer: Self-pay | Admitting: Internal Medicine

## 2012-05-25 MED ORDER — TADALAFIL 20 MG PO TABS: ORAL_TABLET | ORAL | Status: DC

## 2012-05-25 NOTE — Telephone Encounter (Signed)
Hopp please advise if ok to reprint rx for cialis, patient would like to send rx to [place]

## 2012-05-25 NOTE — Telephone Encounter (Signed)
pt called stated his wife took his RX for Cialis to [facility] and he needed to send it to [place], pt would like RX re-printed if approved and he will pick up  --cb# [phone]  NOTE pt has [employer]

## 2012-05-25 NOTE — Telephone Encounter (Signed)
RX reprinted, patient aware and requested that rx be mailed to him, address verified

## 2012-05-25 NOTE — Telephone Encounter (Signed)
Reprint as #30 ; 1 q 3 days prn for [place]

## 2012-08-19 ENCOUNTER — Other Ambulatory Visit: Payer: Self-pay | Admitting: Internal Medicine

## 2012-08-20 NOTE — Telephone Encounter (Signed)
Ok per Dr.Hopper x 5 refills

## 2013-02-03 ENCOUNTER — Other Ambulatory Visit: Payer: Self-pay

## 2013-04-19 ENCOUNTER — Encounter: Payer: Self-pay | Admitting: Internal Medicine

## 2013-04-20 ENCOUNTER — Other Ambulatory Visit: Payer: Self-pay | Admitting: *Deleted

## 2013-04-20 ENCOUNTER — Encounter: Payer: Self-pay | Admitting: *Deleted

## 2013-04-20 MED ORDER — TADALAFIL 20 MG PO TABS: ORAL_TABLET | ORAL | Status: DC

## 2013-04-20 NOTE — Telephone Encounter (Signed)
Ok to refill 

## 2013-06-19 ENCOUNTER — Encounter: Payer: Self-pay | Admitting: Internal Medicine

## 2013-06-20 MED ORDER — PRAVASTATIN SODIUM 40 MG PO TABS: ORAL_TABLET | ORAL | Status: DC

## 2013-06-20 NOTE — Telephone Encounter (Signed)
Rx sent to the pharmacy by e-script.  Pt needs complete physical and fasting labs.//AB/CMA 

## 2013-08-15 ENCOUNTER — Other Ambulatory Visit (INDEPENDENT_AMBULATORY_CARE_PROVIDER_SITE_OTHER): Payer: BC Managed Care – PPO

## 2013-08-15 ENCOUNTER — Encounter: Payer: Self-pay | Admitting: Internal Medicine

## 2013-08-15 ENCOUNTER — Ambulatory Visit (INDEPENDENT_AMBULATORY_CARE_PROVIDER_SITE_OTHER): Payer: BC Managed Care – PPO | Admitting: Internal Medicine

## 2013-08-15 VITALS — BP 146/92 | HR 83 | Temp 98.5°F | Resp 12 | Ht 71.25 in | Wt 232.4 lb

## 2013-08-15 DIAGNOSIS — Z0000 Encounter for general adult medical examination without abnormal findings: Secondary | ICD-10-CM

## 2013-08-15 DIAGNOSIS — E785 Hyperlipidemia, unspecified: Secondary | ICD-10-CM

## 2013-08-15 LAB — CBC WITH DIFFERENTIAL/PLATELET
Basophils Absolute: 0 10*3/uL (ref 0.0–0.1)
Basophils Relative: 0.3 % (ref 0.0–3.0)
EOS PCT: 3.4 % (ref 0.0–5.0)
Eosinophils Absolute: 0.3 10*3/uL (ref 0.0–0.7)
HCT: 44.2 % (ref 39.0–52.0)
HEMOGLOBIN: 14.9 g/dL (ref 13.0–17.0)
LYMPHS ABS: 1.8 10*3/uL (ref 0.7–4.0)
Lymphocytes Relative: 23.2 % (ref 12.0–46.0)
MCHC: 33.7 g/dL (ref 30.0–36.0)
MCV: 84.3 fl (ref 78.0–100.0)
MONOS PCT: 7.6 % (ref 3.0–12.0)
Monocytes Absolute: 0.6 10*3/uL (ref 0.1–1.0)
NEUTROS ABS: 5.1 10*3/uL (ref 1.4–7.7)
Neutrophils Relative %: 65.5 % (ref 43.0–77.0)
Platelets: 210 10*3/uL (ref 150.0–400.0)
RBC: 5.25 Mil/uL (ref 4.22–5.81)
RDW: 13.6 % (ref 11.5–15.5)
WBC: 7.8 10*3/uL (ref 4.0–10.5)

## 2013-08-15 LAB — LIPID PANEL
Cholesterol: 169 mg/dL (ref 0–200)
HDL: 52.2 mg/dL (ref >39.00)
LDL CALC: 105 mg/dL — AB (ref 0–99)
TRIGLYCERIDES: 61 mg/dL (ref 0.0–149.0)
Total CHOL/HDL Ratio: 3
VLDL: 12.2 mg/dL (ref 0.0–40.0)

## 2013-08-15 LAB — HEPATIC FUNCTION PANEL
ALBUMIN: 4.2 g/dL (ref 3.5–5.2)
ALT: 28 U/L (ref 0–53)
AST: 28 U/L (ref 0–37)
Alkaline Phosphatase: 61 U/L (ref 39–117)
Bilirubin, Direct: 0.1 mg/dL (ref 0.0–0.3)
Total Bilirubin: 1.1 mg/dL (ref 0.2–1.2)
Total Protein: 7.4 g/dL (ref 6.0–8.3)

## 2013-08-15 LAB — BASIC METABOLIC PANEL
BUN: 11 mg/dL (ref 6–23)
CO2: 28 mEq/L (ref 19–32)
Calcium: 9.2 mg/dL (ref 8.4–10.5)
Chloride: 102 mEq/L (ref 96–112)
Creatinine, Ser: 1 mg/dL (ref 0.4–1.5)
GFR: 91.07 mL/min (ref >60.00)
GLUCOSE: 84 mg/dL (ref 70–99)
Potassium: 3.8 mEq/L (ref 3.5–5.1)
Sodium: 138 mEq/L (ref 135–145)

## 2013-08-15 LAB — TSH: TSH: 2.42 u[IU]/mL (ref 0.35–4.50)

## 2013-08-15 MED ORDER — PRAVASTATIN SODIUM 40 MG PO TABS: ORAL_TABLET | ORAL | Status: DC

## 2013-08-15 MED ORDER — TADALAFIL 20 MG PO TABS: ORAL_TABLET | ORAL | Status: DC

## 2013-08-15 NOTE — Progress Notes (Signed)
Pre visit review using our clinic review tool, if applicable. No additional management support is needed unless otherwise documented below in the visit note. 

## 2013-08-15 NOTE — Patient Instructions (Addendum)
Your next office appointment will be determined based upon review of your pending labs . Those instructions will be transmitted to you through My Chart    Minimal Blood Pressure Goal= AVERAGE < 140/90;  Ideal is an AVERAGE < 135/85. This AVERAGE should be calculated from @ least 5-7 BP readings taken @ different times of day on different days of week. You should not respond to isolated BP readings , but rather the AVERAGE for that week .Please bring your  blood pressure cuff to office visits to verify that it is reliable.It  can also be checked against the blood pressure device at the pharmacy. Finger or wrist cuffs are not dependable; an arm cuff is.  Reflux of gastric acid may be asymptomatic as this may occur mainly during sleep.The triggers for reflux  include stress; the "aspirin family" ; alcohol; peppermint; and caffeine (coffee, tea, cola, and chocolate). The aspirin family would include aspirin and the nonsteroidal agents such as ibuprofen &  Naproxen. Tylenol would not cause reflux. If having symptoms ; food & drink should be avoided for @ least 2 hours before going to bed.  

## 2013-08-15 NOTE — Progress Notes (Signed)
   Subjective:    Patient ID: David Schneider, male    DOB: 1970/12/08, 43 y.o.   MRN: [id number]  HPI  He is here for a physical;acute issues denied.   A modified  heart healthy diet is followed; no regular exercise . Family history is borderline positive for premature coronary disease. Advanced cholesterol testing reveals  LDL goal is less than 140 ; ideally <110. There is medication compliance with the statin.  Low dose ASA taken   Review of Systems Specifically denied are  chest pain, palpitations, dyspnea, or claudication.  Significant abdominal symptoms, memory deficit, or myalgias not present. Reflux symptoms are mild & occur approximately once/week.         Objective:   Physical Exam Gen.: Healthy and well-nourished in appearance. Alert, appropriate and cooperative throughout exam. Appears younger than stated age  Head: Normocephalic without obvious abnormalities; no alopecia  Eyes: No corneal or conjunctival inflammation noted. Pupils equal round reactive to light and accommodation. Extraocular motion intact.  Ears: External  ear exam reveals no significant lesions or deformities. Canals clear .TMs normal. Hearing is grossly normal bilaterally. Nose: External nasal exam reveals no deformity or inflammation. Nasal mucosa are pink and moist. No lesions or exudates noted.   Mouth: Oral mucosa and oropharynx reveal no lesions or exudates. Teeth in good repair. Neck: No deformities, masses, or tenderness noted. Range of motion &. Thyroid normal Lungs: Normal respiratory effort; chest expands symmetrically. Lungs are clear to auscultation without rales, wheezes, or increased work of breathing. Heart: Normal rate and rhythm. Normal S1 and S2. No gallop, click, or rub. No  murmur. Abdomen: Bowel sounds normal; abdomen soft and nontender. No masses, organomegaly or hernias noted. Genitalia: Genitalia normal except for left varices. Prostate is normal without enlargement,  asymmetry, nodularity, or induration                               Musculoskeletal/extremities: No deformity or scoliosis noted of  the thoracic or lumbar spine.  No clubbing, cyanosis, edema, or significant extremity  deformity noted. Range of motion normal .Tone & strength normal. Hand joints normal Fingernail  health good. Able to lie down & sit up w/o help. Negative SLR bilaterally Vascular: Carotid, radial artery, dorsalis pedis and  posterior tibial pulses are full and equal. No bruits present. Neurologic: Alert and oriented x3. Deep tendon reflexes symmetrical and normal.  Gait normal . Skin: Intact without suspicious lesions or rashes. Lymph: No cervical, axillary, or inguinal lymphadenopathy present. Psych: Mood and affect are normal. Normally interactive                                                                                        Assessment & Plan:  #1 comprehensive physical exam; no acute findings #2 elevated BP w/o hx of HTN  Plan: see Orders  & Recommendationsxx

## 2013-10-18 ENCOUNTER — Encounter: Payer: Self-pay | Admitting: Internal Medicine

## 2013-10-19 ENCOUNTER — Other Ambulatory Visit: Payer: Self-pay | Admitting: Internal Medicine

## 2013-10-19 MED ORDER — SCOPOLAMINE 1 MG/3DAYS TD PT72: 1.0000 | MEDICATED_PATCH | TRANSDERMAL | Status: DC

## 2014-11-06 ENCOUNTER — Other Ambulatory Visit: Payer: Self-pay | Admitting: Internal Medicine

## 2014-11-07 ENCOUNTER — Telehealth: Payer: Self-pay | Admitting: Internal Medicine

## 2014-11-07 ENCOUNTER — Other Ambulatory Visit: Payer: Self-pay | Admitting: Emergency Medicine

## 2014-11-07 DIAGNOSIS — Z0000 Encounter for general adult medical examination without abnormal findings: Secondary | ICD-10-CM

## 2014-11-07 DIAGNOSIS — E785 Hyperlipidemia, unspecified: Secondary | ICD-10-CM

## 2014-11-07 MED ORDER — PRAVASTATIN SODIUM 40 MG PO TABS: ORAL_TABLET | ORAL | Status: DC

## 2014-11-07 NOTE — Telephone Encounter (Signed)
Patient is has an appointment Thursday at 2:30, he wants to come to the lab tomorrow morning to get his labs drawn, please advise

## 2014-11-08 ENCOUNTER — Other Ambulatory Visit (INDEPENDENT_AMBULATORY_CARE_PROVIDER_SITE_OTHER): Payer: BLUE CROSS/BLUE SHIELD

## 2014-11-08 DIAGNOSIS — Z0000 Encounter for general adult medical examination without abnormal findings: Secondary | ICD-10-CM

## 2014-11-08 LAB — HEPATIC FUNCTION PANEL
ALBUMIN: 4 g/dL (ref 3.5–5.2)
ALT: 18 U/L (ref 0–53)
AST: 18 U/L (ref 0–37)
Alkaline Phosphatase: 66 U/L (ref 39–117)
BILIRUBIN DIRECT: 0.1 mg/dL (ref 0.0–0.3)
TOTAL PROTEIN: 7.2 g/dL (ref 6.0–8.3)
Total Bilirubin: 0.4 mg/dL (ref 0.2–1.2)

## 2014-11-08 LAB — BASIC METABOLIC PANEL
BUN: 17 mg/dL (ref 6–23)
CHLORIDE: 105 meq/L (ref 96–112)
CO2: 27 mEq/L (ref 19–32)
Calcium: 9.3 mg/dL (ref 8.4–10.5)
Creatinine, Ser: 1.07 mg/dL (ref 0.40–1.50)
GFR: 78.95 mL/min (ref >60.00)
GLUCOSE: 117 mg/dL — AB (ref 70–99)
POTASSIUM: 4.7 meq/L (ref 3.5–5.1)
Sodium: 140 mEq/L (ref 135–145)

## 2014-11-08 LAB — CBC WITH DIFFERENTIAL/PLATELET
BASOS ABS: 0 10*3/uL (ref 0.0–0.1)
Basophils Relative: 0.7 % (ref 0.0–3.0)
EOS ABS: 0.2 10*3/uL (ref 0.0–0.7)
Eosinophils Relative: 3.6 % (ref 0.0–5.0)
HCT: 43.7 % (ref 39.0–52.0)
Hemoglobin: 14.6 g/dL (ref 13.0–17.0)
LYMPHS PCT: 31.5 % (ref 12.0–46.0)
Lymphs Abs: 1.8 10*3/uL (ref 0.7–4.0)
MCHC: 33.5 g/dL (ref 30.0–36.0)
MCV: 84.1 fl (ref 78.0–100.0)
MONOS PCT: 8.3 % (ref 3.0–12.0)
Monocytes Absolute: 0.5 10*3/uL (ref 0.1–1.0)
NEUTROS ABS: 3.2 10*3/uL (ref 1.4–7.7)
NEUTROS PCT: 55.9 % (ref 43.0–77.0)
Platelets: 221 10*3/uL (ref 150.0–400.0)
RBC: 5.2 Mil/uL (ref 4.22–5.81)
RDW: 14 % (ref 11.5–15.5)
WBC: 5.8 10*3/uL (ref 4.0–10.5)

## 2014-11-08 LAB — LIPID PANEL
Cholesterol: 162 mg/dL (ref 0–200)
HDL: 42.7 mg/dL (ref >39.00)
LDL Cholesterol: 102 mg/dL — ABNORMAL HIGH (ref 0–99)
NONHDL: 118.97
Total CHOL/HDL Ratio: 4
Triglycerides: 85 mg/dL (ref 0.0–149.0)
VLDL: 17 mg/dL (ref 0.0–40.0)

## 2014-11-08 LAB — TSH: TSH: 3.81 u[IU]/mL (ref 0.35–4.50)

## 2014-11-09 ENCOUNTER — Encounter: Payer: Self-pay | Admitting: Internal Medicine

## 2014-11-09 ENCOUNTER — Ambulatory Visit (INDEPENDENT_AMBULATORY_CARE_PROVIDER_SITE_OTHER): Payer: BLUE CROSS/BLUE SHIELD | Admitting: Internal Medicine

## 2014-11-09 VITALS — BP 138/84 | HR 74 | Temp 98.5°F | Resp 18 | Ht 70.5 in | Wt 235.0 lb

## 2014-11-09 DIAGNOSIS — E785 Hyperlipidemia, unspecified: Secondary | ICD-10-CM

## 2014-11-09 DIAGNOSIS — R739 Hyperglycemia, unspecified: Secondary | ICD-10-CM

## 2014-11-09 DIAGNOSIS — Z0000 Encounter for general adult medical examination without abnormal findings: Secondary | ICD-10-CM

## 2014-11-09 DIAGNOSIS — M940 Chondrocostal junction syndrome [Tietze]: Secondary | ICD-10-CM

## 2014-11-09 MED ORDER — TADALAFIL 20 MG PO TABS: ORAL_TABLET | ORAL | Status: DC

## 2014-11-09 MED ORDER — PRAVASTATIN SODIUM 40 MG PO TABS: ORAL_TABLET | ORAL | Status: DC

## 2014-11-09 NOTE — Progress Notes (Signed)
   Subjective:    Patient ID: David Schneider, male    DOB: 1970-12-24, 44 y.o.   MRN: [id number]  HPI He is here for a physical;acute issues include intermittent left sternal pain particularly with the prone position or in the lateral decubitus position in bed. Stretching also aggravates the discomfort.  He follows a heart healthy diet at home but eats out quite frequently. He does not add salt. He only walks once a week. He has no associated cardiopulmonary symptoms. He has been compliant with his pravastatin.  His fasting glucose was 117. His mother and brother have diabetes.  Review of Systems  Exertional chest pain, palpitations, tachycardia, exertional dyspnea, paroxysmal nocturnal dyspnea, claudication or edema are absent. No unexplained weight loss, abdominal pain, significant dyspepsia, dysphagia, melena, rectal bleeding, or persistently small caliber stools. Dysuria, pyuria, hematuria, frequency, nocturia or polyuria are denied. Change in hair, skin, nails denied. No bowel changes of constipation or diarrhea. No intolerance to heat or cold.      Objective:   Physical Exam Pertinent or positive findings include: He slightly tender over the left parasternal border. He has crepitus in the knees. Varices are noted in the scrotum. Prostate is normal to palpation.  General appearance :adequately nourished; in no distress.  Eyes: No conjunctival inflammation or scleral icterus is present.  Oral exam:  Lips and gums are healthy appearing.There is no oropharyngeal erythema or exudate noted. Dental hygiene is good.  Heart:  Normal rate and regular rhythm. S1 and S2 normal without gallop, murmur, click, rub or other extra sounds    Lungs:Chest clear to auscultation; no wheezes, rhonchi,rales ,or rubs present.No increased work of breathing.   Abdomen: bowel sounds normal, soft and non-tender without masses, organomegaly or hernias noted.  No guarding or rebound.   Vascular : all  pulses equal ; no bruits present.  Skin:Warm & dry.  Intact without suspicious lesions or rashes ; no tenting or jaundice   Lymphatic: No lymphadenopathy is noted about the head, neck, axilla, or inguinal areas.   Neuro: Strength, tone & DTRs normal.     Assessment & Plan:  #1 comprehensive physical exam #2 costochondritis  Plan: see Orders  & Recommendations

## 2014-11-09 NOTE — Progress Notes (Signed)
Pre visit review using our clinic review tool, if applicable. No additional management support is needed unless otherwise documented below in the visit note. 

## 2014-11-09 NOTE — Patient Instructions (Signed)
Use an anti-inflammatory cream such as Aspercreme or Zostrix cream twice a day to the affected area as needed. In lieu of this warm moist compresses or  hot water bottle can be used. Do not apply ice . Consider glucosamine sulfate 1500 mg daily for chest wall symptoms. Take this daily  for 3 months and then leave it off for 2 months. This will rehydrate the cartilages.

## 2014-11-10 ENCOUNTER — Other Ambulatory Visit (INDEPENDENT_AMBULATORY_CARE_PROVIDER_SITE_OTHER): Payer: BLUE CROSS/BLUE SHIELD

## 2014-11-10 DIAGNOSIS — R739 Hyperglycemia, unspecified: Secondary | ICD-10-CM

## 2014-11-10 LAB — HEMOGLOBIN A1C: HEMOGLOBIN A1C: 6.1 % (ref 4.6–6.5)

## 2015-01-26 ENCOUNTER — Other Ambulatory Visit: Payer: Self-pay | Admitting: Internal Medicine

## 2015-01-26 MED ORDER — SCOPOLAMINE 1 MG/3DAYS TD PT72: 1.0000 | MEDICATED_PATCH | TRANSDERMAL | Status: DC

## 2016-01-17 ENCOUNTER — Other Ambulatory Visit: Payer: Self-pay | Admitting: Internal Medicine

## 2016-01-17 ENCOUNTER — Telehealth: Payer: Self-pay | Admitting: Internal Medicine

## 2016-01-17 DIAGNOSIS — E785 Hyperlipidemia, unspecified: Secondary | ICD-10-CM

## 2016-01-17 NOTE — Telephone Encounter (Signed)
Pt has and appointment in December.

## 2016-01-17 NOTE — Telephone Encounter (Signed)
Ok with me 

## 2016-01-17 NOTE — Telephone Encounter (Signed)
Patient was a patient of Dr.Hopper's. He would like to know if you would be willing to take him on as a patient. He only comes once a year for a cpe. Please advise. Thank you.

## 2016-01-18 NOTE — Telephone Encounter (Signed)
Rec'd call pt states he has made appt for his CPX on 12/7 need to get refill on pravastatin. Inform pt will send enough until appt...Raechel Chute/lmb

## 2016-03-06 ENCOUNTER — Ambulatory Visit (INDEPENDENT_AMBULATORY_CARE_PROVIDER_SITE_OTHER): Payer: BLUE CROSS/BLUE SHIELD | Admitting: Internal Medicine

## 2016-03-06 ENCOUNTER — Encounter: Payer: Self-pay | Admitting: Internal Medicine

## 2016-03-06 ENCOUNTER — Other Ambulatory Visit (INDEPENDENT_AMBULATORY_CARE_PROVIDER_SITE_OTHER): Payer: BLUE CROSS/BLUE SHIELD

## 2016-03-06 VITALS — BP 136/74 | HR 87 | Temp 98.4°F | Resp 20 | Wt 241.0 lb

## 2016-03-06 DIAGNOSIS — Z0001 Encounter for general adult medical examination with abnormal findings: Secondary | ICD-10-CM | POA: Insufficient documentation

## 2016-03-06 DIAGNOSIS — R739 Hyperglycemia, unspecified: Secondary | ICD-10-CM

## 2016-03-06 DIAGNOSIS — F528 Other sexual dysfunction not due to a substance or known physiological condition: Secondary | ICD-10-CM | POA: Diagnosis not present

## 2016-03-06 DIAGNOSIS — Z0000 Encounter for general adult medical examination without abnormal findings: Secondary | ICD-10-CM | POA: Insufficient documentation

## 2016-03-06 LAB — BASIC METABOLIC PANEL
BUN: 14 mg/dL (ref 6–23)
CHLORIDE: 105 meq/L (ref 96–112)
CO2: 29 meq/L (ref 19–32)
CREATININE: 0.99 mg/dL (ref 0.40–1.50)
Calcium: 9.7 mg/dL (ref 8.4–10.5)
GFR: 85.87 mL/min (ref >60.00)
GLUCOSE: 112 mg/dL — AB (ref 70–99)
Potassium: 4.4 mEq/L (ref 3.5–5.1)
Sodium: 141 mEq/L (ref 135–145)

## 2016-03-06 LAB — HEMOGLOBIN A1C: HEMOGLOBIN A1C: 6.2 % (ref 4.6–6.5)

## 2016-03-06 LAB — LIPID PANEL
CHOL/HDL RATIO: 3
CHOLESTEROL: 170 mg/dL (ref 0–200)
HDL: 50.3 mg/dL (ref >39.00)
LDL Cholesterol: 104 mg/dL — ABNORMAL HIGH (ref 0–99)
NonHDL: 119.74
TRIGLYCERIDES: 81 mg/dL (ref 0.0–149.0)
VLDL: 16.2 mg/dL (ref 0.0–40.0)

## 2016-03-06 LAB — URINALYSIS, ROUTINE W REFLEX MICROSCOPIC
Bilirubin Urine: NEGATIVE
HGB URINE DIPSTICK: NEGATIVE
KETONES UR: NEGATIVE
Leukocytes, UA: NEGATIVE
NITRITE: NEGATIVE
RBC / HPF: NONE SEEN (ref 0–?)
Specific Gravity, Urine: 1.02 (ref 1.000–1.030)
TOTAL PROTEIN, URINE-UPE24: NEGATIVE
URINE GLUCOSE: NEGATIVE
UROBILINOGEN UA: 0.2 (ref 0.0–1.0)
pH: 6 (ref 5.0–8.0)

## 2016-03-06 LAB — HEPATIC FUNCTION PANEL
ALBUMIN: 4.4 g/dL (ref 3.5–5.2)
ALT: 26 U/L (ref 0–53)
AST: 21 U/L (ref 0–37)
Alkaline Phosphatase: 68 U/L (ref 39–117)
BILIRUBIN TOTAL: 0.4 mg/dL (ref 0.2–1.2)
Bilirubin, Direct: 0.1 mg/dL (ref 0.0–0.3)
TOTAL PROTEIN: 7.4 g/dL (ref 6.0–8.3)

## 2016-03-06 LAB — CBC WITH DIFFERENTIAL/PLATELET
BASOS ABS: 0 10*3/uL (ref 0.0–0.1)
Basophils Relative: 0.5 % (ref 0.0–3.0)
EOS ABS: 0.2 10*3/uL (ref 0.0–0.7)
Eosinophils Relative: 3.3 % (ref 0.0–5.0)
HEMATOCRIT: 44.8 % (ref 39.0–52.0)
HEMOGLOBIN: 15.3 g/dL (ref 13.0–17.0)
LYMPHS PCT: 23.6 % (ref 12.0–46.0)
Lymphs Abs: 1.7 10*3/uL (ref 0.7–4.0)
MCHC: 34.1 g/dL (ref 30.0–36.0)
MCV: 82.6 fl (ref 78.0–100.0)
Monocytes Absolute: 0.6 10*3/uL (ref 0.1–1.0)
Monocytes Relative: 8.1 % (ref 3.0–12.0)
Neutro Abs: 4.5 10*3/uL (ref 1.4–7.7)
Neutrophils Relative %: 64.5 % (ref 43.0–77.0)
Platelets: 227 10*3/uL (ref 150.0–400.0)
RBC: 5.43 Mil/uL (ref 4.22–5.81)
RDW: 14.5 % (ref 11.5–15.5)
WBC: 7 10*3/uL (ref 4.0–10.5)

## 2016-03-06 LAB — PSA: PSA: 2.45 ng/mL (ref 0.10–4.00)

## 2016-03-06 LAB — TSH: TSH: 2.59 u[IU]/mL (ref 0.35–4.50)

## 2016-03-06 MED ORDER — TADALAFIL 20 MG PO TABS: ORAL_TABLET | ORAL | 11 refills | Status: DC

## 2016-03-06 NOTE — Progress Notes (Signed)
Pre visit review using our clinic review tool, if applicable. No additional management support is needed unless otherwise documented below in the visit note. 

## 2016-03-06 NOTE — Patient Instructions (Signed)
Please continue all other medications as before, and refills have been done if requested - the cialis  Please have the pharmacy call with any other refills you may need.  Please continue your efforts at being more active, low cholesterol diet, and weight control.  You are otherwise up to date with prevention measures today.  Please keep your appointments with your specialists as you may have planned  Please go to the LAB in the Basement (turn left off the elevator) for the tests to be done today  You will be contacted by phone if any changes need to be made immediately.  Otherwise, you will receive a letter about your results with an explanation, but please check with MyChart first.  Please remember to sign up for MyChart if you have not done so, as this will be important to you in the future with finding out test results, communicating by private email, and scheduling acute appointments online when needed.  Please return in 1 year for your yearly visit, or sooner if needed, with Lab testing done 3-5 days before   

## 2016-03-06 NOTE — Progress Notes (Signed)
Subjective:    Patient ID: David ChinJoseph A Knauff, male    DOB: 03-Apr-1970, 45 y.o.   MRN: [id number]  HPI  Here for wellness and f/u;  Overall doing ok;  Pt denies Chest pain, worsening SOB, DOE, wheezing, orthopnea, PND, worsening LE edema, palpitations, dizziness or syncope.  Pt denies neurological change such as new headache, facial or extremity weakness.  Pt denies polydipsia, polyuria, or low sugar symptoms. Pt states overall good compliance with treatment and medications, good tolerability, and has been trying to follow appropriate diet.  Pt denies worsening depressive symptoms, suicidal ideation or panic. No fever, night sweats, wt loss, loss of appetite, or other constitutional symptoms.  Pt states good ability with ADL's, has low fall risk, home safety reviewed and adequate, no other significant changes in hearing or vision, and only occasionally active with exercise. Declines flu shot. No new history Past Medical History:  Diagnosis Date  . Allergic rhinitis    seasonal  . ED (erectile dysfunction)   . Hyperlipidemia    Past Surgical History:  Procedure Laterality Date  . TONSILLECTOMY    . WISDOM TOOTH EXTRACTION      reports that he has never smoked. He does not have any smokeless tobacco history on file. He reports that he drinks about 3.6 oz of alcohol per week . He reports that he does not use drugs. family history includes Diabetes in his brother, maternal grandmother, and mother; Heart attack in his maternal grandmother and mother; Hyperlipidemia in his father and mother; Hypertension in his father; Throat cancer in his maternal grandfather; Transient ischemic attack in his mother. Allergies  Allergen Reactions  . Fexofenadine-Pseudoephed Er     REACTION: headache   Current Outpatient Prescriptions on File Prior to Visit  Medication Sig Dispense Refill  . aspirin 81 MG tablet      . Omega-3 Fatty Acids (FISH OIL) 1000 MG CAPS Take by mouth daily.      . pravastatin  (PRAVACHOL) 40 MG tablet Take 1 tablet (40 mg total) by mouth at bedtime. Keep Dec appt for future refills 90 tablet 0   No current facility-administered medications on file prior to visit.    Review of Systems Constitutional: Negative for increased diaphoresis, or other activity, appetite or siginficant weight change other than noted HENT: Negative for worsening hearing loss, ear pain, facial swelling, mouth sores and neck stiffness.   Eyes: Negative for other worsening pain, redness or visual disturbance.  Respiratory: Negative for choking or stridor Cardiovascular: Negative for other chest pain and palpitations.  Gastrointestinal: Negative for worsening diarrhea, blood in stool, or abdominal distention Genitourinary: Negative for hematuria, flank pain or change in urine volume.  Musculoskeletal: Negative for myalgias or other joint complaints.  Skin: Negative for other color change and wound or drainage.  Neurological: Negative for syncope and numbness. other than noted Hematological: Negative for adenopathy. or other swelling Psychiatric/Behavioral: Negative for hallucinations, SI, self-injury, decreased concentration or other worsening agitation.  All other system neg per pt    Objective:   Physical Exam BP 136/74   Pulse 87   Temp 98.4 F (36.9 C) (Oral)   Resp 20   Wt 241 lb (109.3 kg)   SpO2 96%   BMI 34.09 kg/m  VS noted,  Constitutional: Pt is oriented to person, place, and time. Appears well-developed and well-nourished, in no significant distress Head: Normocephalic and atraumatic  Eyes: Conjunctivae and EOM are normal. Pupils are equal, round, and reactive to light Right Ear:  External ear normal.  Left Ear: External ear normal Nose: Nose normal.  Mouth/Throat: Oropharynx is clear and moist  Neck: Normal range of motion. Neck supple. No JVD present. No tracheal deviation present or significant neck LA or mass Cardiovascular: Normal rate, regular rhythm, normal heart  sounds and intact distal pulses.   Pulmonary/Chest: Effort normal and breath sounds without rales or wheezing  Abdominal: Soft. Bowel sounds are normal. NT. No HSM  Musculoskeletal: Normal range of motion. Exhibits no edema Lymphadenopathy: Has no cervical adenopathy.  Neurological: Pt is alert and oriented to person, place, and time. Pt has normal reflexes. No cranial nerve deficit. Motor grossly intact Skin: Skin is warm and dry. No rash noted or new ulcers Psychiatric:  Has normal mood and affect. Behavior is normal.  No other new exam findings    Assessment & Plan:

## 2016-03-09 NOTE — Assessment & Plan Note (Signed)

## 2016-03-09 NOTE — Assessment & Plan Note (Signed)
Ok for PDE-5 prn,  to f/u any worsening symptoms or concerns  

## 2016-03-09 NOTE — Assessment & Plan Note (Signed)
stable overall by history and exam, recent data reviewed with pt, and pt to continue medical treatment as before,  to f/u any worsening symptoms or concerns Lab Results  Component Value Date   HGBA1C 6.2 03/06/2016

## 2016-04-22 ENCOUNTER — Other Ambulatory Visit: Payer: Self-pay | Admitting: Internal Medicine

## 2016-04-22 DIAGNOSIS — E785 Hyperlipidemia, unspecified: Secondary | ICD-10-CM

## 2016-07-25 ENCOUNTER — Other Ambulatory Visit: Payer: Self-pay | Admitting: *Deleted

## 2016-07-25 DIAGNOSIS — E7849 Other hyperlipidemia: Secondary | ICD-10-CM

## 2016-07-25 MED ORDER — PRAVASTATIN SODIUM 40 MG PO TABS: ORAL_TABLET | ORAL | 2 refills | Status: DC

## 2016-07-25 NOTE — Telephone Encounter (Signed)
Rec'd call pt states he no longer uses [facility] has change to [facility] want script sent to [facility]. Inform will resend to [facility], also called [facility] spoke w/Joe cancel rx that was sent to them...Raechel Chute

## 2016-07-25 NOTE — Addendum Note (Signed)
Addended by: Deatra James on: 07/25/2016 10:13 AM   Modules accepted: Orders

## 2017-01-06 DIAGNOSIS — H52223 Regular astigmatism, bilateral: Secondary | ICD-10-CM | POA: Diagnosis not present

## 2017-01-06 DIAGNOSIS — H524 Presbyopia: Secondary | ICD-10-CM | POA: Diagnosis not present

## 2017-03-11 ENCOUNTER — Encounter: Payer: BLUE CROSS/BLUE SHIELD | Admitting: Internal Medicine

## 2017-04-21 ENCOUNTER — Other Ambulatory Visit (INDEPENDENT_AMBULATORY_CARE_PROVIDER_SITE_OTHER): Payer: BLUE CROSS/BLUE SHIELD

## 2017-04-21 ENCOUNTER — Encounter: Payer: Self-pay | Admitting: Internal Medicine

## 2017-04-21 ENCOUNTER — Ambulatory Visit (INDEPENDENT_AMBULATORY_CARE_PROVIDER_SITE_OTHER): Payer: BLUE CROSS/BLUE SHIELD | Admitting: Internal Medicine

## 2017-04-21 VITALS — BP 124/82 | HR 81 | Temp 98.5°F | Ht 70.5 in | Wt 223.0 lb

## 2017-04-21 DIAGNOSIS — E7849 Other hyperlipidemia: Secondary | ICD-10-CM | POA: Diagnosis not present

## 2017-04-21 DIAGNOSIS — Z0000 Encounter for general adult medical examination without abnormal findings: Secondary | ICD-10-CM | POA: Diagnosis not present

## 2017-04-21 DIAGNOSIS — R739 Hyperglycemia, unspecified: Secondary | ICD-10-CM | POA: Diagnosis not present

## 2017-04-21 DIAGNOSIS — Z114 Encounter for screening for human immunodeficiency virus [HIV]: Secondary | ICD-10-CM

## 2017-04-21 DIAGNOSIS — E785 Hyperlipidemia, unspecified: Secondary | ICD-10-CM

## 2017-04-21 LAB — HEPATIC FUNCTION PANEL
ALK PHOS: 59 U/L (ref 39–117)
ALT: 17 U/L (ref 0–53)
AST: 17 U/L (ref 0–37)
Albumin: 0 g/dL — ABNORMAL LOW (ref 3.5–5.2)
BILIRUBIN DIRECT: 0.1 mg/dL (ref 0.0–0.3)
BILIRUBIN TOTAL: 0.5 mg/dL (ref 0.2–1.2)
Total Protein: 6.7 g/dL (ref 6.0–8.3)

## 2017-04-21 LAB — CBC WITH DIFFERENTIAL/PLATELET
BASOS ABS: 0 10*3/uL (ref 0.0–0.1)
Basophils Relative: 0.7 % (ref 0.0–3.0)
EOS ABS: 0.1 10*3/uL (ref 0.0–0.7)
Eosinophils Relative: 2.6 % (ref 0.0–5.0)
HEMATOCRIT: 46 % (ref 39.0–52.0)
Hemoglobin: 15.4 g/dL (ref 13.0–17.0)
LYMPHS PCT: 26.1 % (ref 12.0–46.0)
Lymphs Abs: 1.3 10*3/uL (ref 0.7–4.0)
MCHC: 33.6 g/dL (ref 30.0–36.0)
MCV: 85 fl (ref 78.0–100.0)
Monocytes Absolute: 0.4 10*3/uL (ref 0.1–1.0)
Monocytes Relative: 8 % (ref 3.0–12.0)
NEUTROS ABS: 3.1 10*3/uL (ref 1.4–7.7)
NEUTROS PCT: 62.6 % (ref 43.0–77.0)
PLATELETS: 226 10*3/uL (ref 150.0–400.0)
RBC: 5.41 Mil/uL (ref 4.22–5.81)
RDW: 14.5 % (ref 11.5–15.5)
WBC: 4.9 10*3/uL (ref 4.0–10.5)

## 2017-04-21 LAB — URINALYSIS, ROUTINE W REFLEX MICROSCOPIC
BILIRUBIN URINE: NEGATIVE
HGB URINE DIPSTICK: NEGATIVE
KETONES UR: NEGATIVE
LEUKOCYTES UA: NEGATIVE
NITRITE: NEGATIVE
RBC / HPF: NONE SEEN (ref 0–?)
Specific Gravity, Urine: 1.02 (ref 1.000–1.030)
Total Protein, Urine: NEGATIVE
URINE GLUCOSE: NEGATIVE
UROBILINOGEN UA: 0.2 (ref 0.0–1.0)
pH: 6 (ref 5.0–8.0)

## 2017-04-21 LAB — LIPID PANEL
CHOLESTEROL: 157 mg/dL (ref 0–200)
HDL: 40.7 mg/dL (ref >39.00)
LDL CALC: 96 mg/dL (ref 0–99)
NonHDL: 115.82
TRIGLYCERIDES: 101 mg/dL (ref 0.0–149.0)
Total CHOL/HDL Ratio: 4
VLDL: 20.2 mg/dL (ref 0.0–40.0)

## 2017-04-21 LAB — BASIC METABOLIC PANEL
BUN: 11 mg/dL (ref 6–23)
CHLORIDE: 104 meq/L (ref 96–112)
CO2: 30 mEq/L (ref 19–32)
CREATININE: 1 mg/dL (ref 0.40–1.50)
Calcium: 9.4 mg/dL (ref 8.4–10.5)
GFR: 84.48 mL/min (ref >60.00)
Glucose, Bld: 117 mg/dL — ABNORMAL HIGH (ref 70–99)
Potassium: 4.6 mEq/L (ref 3.5–5.1)
Sodium: 140 mEq/L (ref 135–145)

## 2017-04-21 LAB — HEMOGLOBIN A1C: Hgb A1c MFr Bld: 6 % (ref 4.6–6.5)

## 2017-04-21 LAB — PSA: PSA: 1.86 ng/mL (ref 0.10–4.00)

## 2017-04-21 LAB — TSH: TSH: 2.6 u[IU]/mL (ref 0.35–4.50)

## 2017-04-21 MED ORDER — TADALAFIL 20 MG PO TABS: ORAL_TABLET | ORAL | 11 refills | Status: DC

## 2017-04-21 MED ORDER — PRAVASTATIN SODIUM 40 MG PO TABS: ORAL_TABLET | ORAL | 3 refills | Status: DC

## 2017-04-21 NOTE — Progress Notes (Signed)
Subjective:    Patient ID: David Schneider, male    DOB: 1970-11-09, 47 y.o.   MRN: [id number]  HPI  Here for wellness and f/u;  Overall doing ok;  Pt denies Chest pain, worsening SOB, DOE, wheezing, orthopnea, PND, worsening LE edema, palpitations, dizziness or syncope.  Pt denies neurological change such as new headache, facial or extremity weakness.  Pt denies polydipsia, polyuria, or low sugar symptoms. Pt states overall good compliance with treatment and medications, good tolerability, and has been trying to follow appropriate diet.  Pt denies worsening depressive symptoms, suicidal ideation or panic. No fever, night sweats, wt loss, loss of appetite, or other constitutional symptoms.  Pt states good ability with ADL's, has low fall risk, home safety reviewed and adequate, no other significant changes in hearing or vision, and only occasionally active with exercise.  Lost 18 lbs in 1 yr with better diet but not really more active, just some. Wt Readings from Last 3 Encounters:  04/21/17 223 lb (101.2 kg)  03/06/16 241 lb (109.3 kg)  11/09/14 235 lb (106.6 kg)  Declines flu shot.  Now with rx glasses new this year. BP Readings from Last 3 Encounters:  04/21/17 124/82  03/06/16 136/74  11/09/14 138/84   Past Medical History:  Diagnosis Date  . Allergic rhinitis    seasonal  . ED (erectile dysfunction)   . Hyperlipidemia    Past Surgical History:  Procedure Laterality Date  . TONSILLECTOMY    . WISDOM TOOTH EXTRACTION      reports that  has never smoked. he has never used smokeless tobacco. He reports that he drinks about 3.6 oz of alcohol per week. He reports that he does not use drugs. family history includes Diabetes in his brother, maternal grandmother, and mother; Heart attack in his maternal grandmother and mother; Hyperlipidemia in his father and mother; Hypertension in his father; Throat cancer in his maternal grandfather; Transient ischemic attack in his mother. Allergies    Allergen Reactions  . Fexofenadine-Pseudoephed Er     REACTION: headache   Current Outpatient Medications on File Prior to Visit  Medication Sig Dispense Refill  . aspirin 81 MG tablet       No current facility-administered medications on file prior to visit.    Review of Systems Constitutional: Negative for other unusual diaphoresis, sweats, appetite or weight changes HENT: Negative for other worsening hearing loss, ear pain, facial swelling, mouth sores or neck stiffness.   Eyes: Negative for other worsening pain, redness or other visual disturbance.  Respiratory: Negative for other stridor or swelling Cardiovascular: Negative for other palpitations or other chest pain  Gastrointestinal: Negative for worsening diarrhea or loose stools, blood in stool, distention or other pain Genitourinary: Negative for hematuria, flank pain or other change in urine volume.  Musculoskeletal: Negative for myalgias or other joint swelling.  Skin: Negative for other color change, or other wound or worsening drainage.  Neurological: Negative for other syncope or numbness. Hematological: Negative for other adenopathy or swelling Psychiatric/Behavioral: Negative for hallucinations, other worsening agitation, SI, self-injury, or new decreased concentration All other system neg per pt    Objective:   Physical Exam BP 124/82   Pulse 81   Temp 98.5 F (36.9 C) (Oral)   Ht 5' 10.5" (1.791 m)   Wt 223 lb (101.2 kg)   SpO2 98%   BMI 31.54 kg/m  VS noted,  Constitutional: Pt is oriented to person, place, and time. Appears well-developed and well-nourished, in no  significant distress and comfortable Head: Normocephalic and atraumatic  Eyes: Conjunctivae and EOM are normal. Pupils are equal, round, and reactive to light Right Ear: External ear normal without discharge Left Ear: External ear normal without discharge Nose: Nose without discharge or deformity Mouth/Throat: Oropharynx is without other  ulcerations and moist  Neck: Normal range of motion. Neck supple. No JVD present. No tracheal deviation present or significant neck LA or mass Cardiovascular: Normal rate, regular rhythm, normal heart sounds and intact distal pulses.   Pulmonary/Chest: WOB normal and breath sounds without rales or wheezing  Abdominal: Soft. Bowel sounds are normal. NT. No HSM  Musculoskeletal: Normal range of motion. Exhibits no edema Lymphadenopathy: Has no other cervical adenopathy.  Neurological: Pt is alert and oriented to person, place, and time. Pt has normal reflexes. No cranial nerve deficit. Motor grossly intact, Gait intact Skin: Skin is warm and dry. No rash noted or new ulcerations Psychiatric:  Has normal mood and affect. Behavior is normal without agitation No other exam findings    Assessment & Plan:

## 2017-04-21 NOTE — Patient Instructions (Signed)

## 2017-04-22 LAB — HIV ANTIBODY (ROUTINE TESTING W REFLEX): HIV: NONREACTIVE

## 2017-04-23 NOTE — Assessment & Plan Note (Signed)
Lab Results  Component Value Date   HGBA1C 6.0 04/21/2017  stable overall by history and exam, recent data reviewed with pt, and pt to continue medical treatment as before,  to f/u any worsening symptoms or concerns 

## 2017-04-23 NOTE — Assessment & Plan Note (Signed)

## 2017-04-23 NOTE — Assessment & Plan Note (Signed)
stable overall by history and exam, recent data reviewed with pt, and pt to continue medical treatment as before,  to f/u any worsening symptoms or concerns Lab Results  Component Value Date   LDLCALC 96 04/21/2017

## 2017-11-19 ENCOUNTER — Encounter: Payer: Self-pay | Admitting: Internal Medicine

## 2017-11-19 MED ORDER — TRIAMCINOLONE ACETONIDE 0.1 % EX CREA: 1.0000 "application " | TOPICAL_CREAM | Freq: Two times a day (BID) | CUTANEOUS | 0 refills | Status: AC

## 2017-11-19 NOTE — Telephone Encounter (Signed)
Please advise. Thanks.  

## 2018-02-04 ENCOUNTER — Encounter: Payer: Self-pay | Admitting: Internal Medicine

## 2018-02-04 ENCOUNTER — Ambulatory Visit: Payer: BLUE CROSS/BLUE SHIELD | Admitting: Internal Medicine

## 2018-02-04 DIAGNOSIS — R739 Hyperglycemia, unspecified: Secondary | ICD-10-CM

## 2018-02-04 DIAGNOSIS — F419 Anxiety disorder, unspecified: Secondary | ICD-10-CM | POA: Diagnosis not present

## 2018-02-04 DIAGNOSIS — M545 Low back pain, unspecified: Secondary | ICD-10-CM | POA: Insufficient documentation

## 2018-02-04 DIAGNOSIS — M5441 Lumbago with sciatica, right side: Secondary | ICD-10-CM | POA: Diagnosis not present

## 2018-02-04 MED ORDER — LORAZEPAM 0.5 MG PO TABS: 0.5000 mg | ORAL_TABLET | Freq: Two times a day (BID) | ORAL | 0 refills | Status: DC | PRN

## 2018-02-04 MED ORDER — TRAMADOL HCL 50 MG PO TABS: 50.0000 mg | ORAL_TABLET | Freq: Four times a day (QID) | ORAL | 0 refills | Status: DC | PRN

## 2018-02-04 MED ORDER — PREDNISONE 10 MG PO TABS: ORAL_TABLET | ORAL | 0 refills | Status: DC

## 2018-02-04 NOTE — Assessment & Plan Note (Signed)
stable overall by history and exam, recent data reviewed with pt, and pt to continue medical treatment as before,  to f/u any worsening symptoms or concerns  

## 2018-02-04 NOTE — Progress Notes (Signed)
Subjective:    Patient ID: David Schneider, male    DOB: October 05, 1970, 47 y.o.   MRN: [id number]  HPI    Here with c/o acute onset LBP midline and right lower x 12 days after bending under a cabinet in a twisting kind of motion, sharp, mod to severe, constant, with some radiation to the right buttock and leg to the knee, but no bowel or bladder change, fever, wt loss,  worsening LE numbness/weakness, gait change or falls.  Wife asks him to get looked at.  No prior hx of same.  Pt denies chest pain, increased sob or doe, wheezing, orthopnea, PND, increased LE swelling, palpitations, dizziness or syncope.  Pt denies new neurological symptoms such as new headache, or facial or extremity weakness or numbness   Pt denies polydipsia, polyuria Past Medical History:  Diagnosis Date  . Allergic rhinitis    seasonal  . ED (erectile dysfunction)   . Hyperlipidemia    Past Surgical History:  Procedure Laterality Date  . TONSILLECTOMY    . WISDOM TOOTH EXTRACTION      reports that he has never smoked. He has never used smokeless tobacco. He reports that he drinks about 6.0 standard drinks of alcohol per week. He reports that he does not use drugs. family history includes Diabetes in his brother, maternal grandmother, and mother; Heart attack in his maternal grandmother and mother; Hyperlipidemia in his father and mother; Hypertension in his father; Throat cancer in his maternal grandfather; Transient ischemic attack in his mother. Allergies  Allergen Reactions  . Fexofenadine-Pseudoephed Er     REACTION: headache   Current Outpatient Medications on File Prior to Visit  Medication Sig Dispense Refill  . aspirin 81 MG tablet      . pravastatin (PRAVACHOL) 40 MG tablet TAKE 1 TABLET BY MOUTH AT BEDTIME. 90 tablet 3  . tadalafil (CIALIS) 20 MG tablet TAKE 1 TABLET BY MOUTH EVERY 3 DAYS AS NEEDED 30 tablet 11  . triamcinolone cream (KENALOG) 0.1 % Apply 1 application topically 2 (two) times daily. 30 g  0   No current facility-administered medications on file prior to visit.    Review of Systems  Constitutional: Negative for other unusual diaphoresis or sweats HENT: Negative for ear discharge or swelling Eyes: Negative for other worsening visual disturbances Respiratory: Negative for stridor or other swelling  Gastrointestinal: Negative for worsening distension or other blood Genitourinary: Negative for retention or other urinary change Musculoskeletal: Negative for other MSK pain or swelling Skin: Negative for color change or other new lesions Neurological: Negative for worsening tremors and other numbness  Psychiatric/Behavioral: Negative for worsening agitation or other fatigue but 2+ nervous All there system neg per pt    Objective:   Physical Exam BP 120/82   Pulse 97   Temp 98.4 F (36.9 C) (Oral)   Ht 5' 10.5" (1.791 m)   Wt 234 lb (106.1 kg)   SpO2 96%   BMI 33.10 kg/m  VS noted,  Constitutional: Pt appears in NAD HENT: Head: NCAT.  Right Ear: External ear normal.  Left Ear: External ear normal.  Eyes: . Pupils are equal, round, and reactive to light. Conjunctivae and EOM are normal Nose: without d/c or deformity Neck: Neck supple. Gross normal ROM Cardiovascular: Normal rate and regular rhythm.   Pulmonary/Chest: Effort normal and breath sounds without rales or wheezing.  Abd:  Soft, NT, ND, + BS, no organomegaly Spine nontender in midline or buttock or leg Neurological: Pt is  alert. At baseline orientation, motor grossly intact Skin: Skin is warm. No rashes, other new lesions, no LE edema Psychiatric: Pt behavior is normal without agitation  No other exam findings Lab Results  Component Value Date   WBC 4.9 04/21/2017   HGB 15.4 04/21/2017   HCT 46.0 04/21/2017   PLT 226.0 04/21/2017   GLUCOSE 117 (H) 04/21/2017   CHOL 157 04/21/2017   TRIG 101.0 04/21/2017   HDL 40.70 04/21/2017   LDLDIRECT 157.4 06/23/2007   LDLCALC 96 04/21/2017   ALT 17  04/21/2017   AST 17 04/21/2017   NA 140 04/21/2017   K 4.6 04/21/2017   CL 104 04/21/2017   CREATININE 1.00 04/21/2017   BUN 11 04/21/2017   CO2 30 04/21/2017   TSH 2.60 04/21/2017   PSA 1.86 04/21/2017   HGBA1C 6.0 04/21/2017       Assessment & Plan:

## 2018-02-04 NOTE — Patient Instructions (Signed)
Please take all new medication as prescribed - the tramadol for pain, and prednisone for anti-inflammation  Please take all new medication as prescribed - the ativan for nerves and muscle relaxer  Please be aware by law that for acute pain, we are limited to 5 day supply of tramadol, but please call if you need further and a "full" prescription can be easily sent to your pharmacy  Please continue all other medications as before, and refills have been done if requested.  Please have the pharmacy call with any other refills you may need.  Please keep your appointments with your specialists as you may have planned

## 2018-02-04 NOTE — Assessment & Plan Note (Signed)
Etiology unclear, exam c/w probable underlying lumbar djd/ddd with possible sciatica, no neuro change, ok for tramadol prn, predpac asd, and consider sport med f/u if not improved , would hold on imaging for now

## 2018-02-04 NOTE — Assessment & Plan Note (Signed)
Situational, also for limited ativan bid prn for nerves and muscle relaxer prn

## 2018-02-10 ENCOUNTER — Telehealth: Payer: Self-pay | Admitting: Internal Medicine

## 2018-02-10 MED ORDER — GABAPENTIN 100 MG PO CAPS: ORAL_CAPSULE | ORAL | 1 refills | Status: DC

## 2018-02-10 NOTE — Telephone Encounter (Signed)
Copied from CRM [id number]. Topic: General - Other >> Feb 10, 2018  8:10 AM Tamela OddiHarris, Freda Jaquith J wrote: Reason for CRM: Patient called to inform the doctor that the pain medication he prescribed at his last visit is not working at all.  The patient would like an alternative medication.  Patient states he feels the same as when he came in to see the doctor.  CB# [phone] or Work - [phone], ext. [phone]

## 2018-02-10 NOTE — Telephone Encounter (Signed)
Ok to add gabapentin 100 mg - 1-2 tab up to three times per day - done erx

## 2018-02-10 NOTE — Addendum Note (Signed)
Addended by: Corwin LevinsJOHN, JAMES W on: 02/10/2018 12:08 PM   Modules accepted: Orders

## 2018-02-11 ENCOUNTER — Encounter: Payer: Self-pay | Admitting: Internal Medicine

## 2018-02-11 MED ORDER — TRAMADOL HCL 50 MG PO TABS: 50.0000 mg | ORAL_TABLET | Freq: Four times a day (QID) | ORAL | 2 refills | Status: DC | PRN

## 2018-02-11 MED ORDER — GABAPENTIN 100 MG PO CAPS: ORAL_CAPSULE | ORAL | 1 refills | Status: DC

## 2018-02-11 NOTE — Telephone Encounter (Signed)
Gabapentin and ultram refilled

## 2018-03-01 ENCOUNTER — Ambulatory Visit: Payer: BLUE CROSS/BLUE SHIELD | Admitting: Family Medicine

## 2018-03-01 ENCOUNTER — Ambulatory Visit (INDEPENDENT_AMBULATORY_CARE_PROVIDER_SITE_OTHER)
Admission: RE | Admit: 2018-03-01 | Discharge: 2018-03-01 | Disposition: A | Discharge status: Home / Self Care | Payer: BLUE CROSS/BLUE SHIELD | Source: Ambulatory Visit | Attending: Family Medicine | Admitting: Family Medicine

## 2018-03-01 ENCOUNTER — Encounter: Payer: Self-pay | Admitting: Family Medicine

## 2018-03-01 VITALS — BP 142/72 | HR 81 | Ht 70.5 in | Wt 237.0 lb

## 2018-03-01 DIAGNOSIS — M5441 Lumbago with sciatica, right side: Secondary | ICD-10-CM

## 2018-03-01 DIAGNOSIS — M545 Low back pain, unspecified: Secondary | ICD-10-CM

## 2018-03-01 DIAGNOSIS — M532X6 Spinal instabilities, lumbar region: Secondary | ICD-10-CM

## 2018-03-01 MED ORDER — MELOXICAM 15 MG PO TABS: 15.0000 mg | ORAL_TABLET | Freq: Every day | ORAL | 0 refills | Status: DC

## 2018-03-01 NOTE — Patient Instructions (Addendum)
Good to see you  Xray downstairs to make sure everything is going well  Ice 20 minutes 2 times daily. Usually after activity and before bed. Exercises 3 times a week.  Continue the gabapentin 200mg  at night Meloxicam daily for 10 days then as needed if not gone before your trip  Get you a letter for an adjuatable standing desk  If not perfect then see me again in 4 weeks

## 2018-03-01 NOTE — Progress Notes (Signed)
[facility] [address] [place], [place] [address] Phone: [phone] Subjective:   David Schneider, am serving as a scribe for Dr. Antoine Primas. Referred for evaluation of back pain by Corwin Levins, MD   CC: Back pain follow-up  BJY:[id number]  David Schneider is a 47 y.o. male coming in with complaint of back pain. Pain for past 5 weeks when he bent over to get something out of cabinet below the sink. Did go to chiropractor in his 59s. Patient has radiating pain with lumbar flexion. Has tried stretching which does seem to help. Is using gabapentin and tramadol.  Patient has been making improvements only.  Not stopping him from daily activities.     Past Medical History:  Diagnosis Date  . Allergic rhinitis    seasonal  . ED (erectile dysfunction)   . Hyperlipidemia    Past Surgical History:  Procedure Laterality Date  . TONSILLECTOMY    . WISDOM TOOTH EXTRACTION     Social History   Socioeconomic History  . Marital status: Married    Spouse name: Not on file  . Number of children: Not on file  . Years of education: Not on file  . Highest education level: Not on file  Occupational History  . Occupation: Engineer, maintenance: [employer]  Social Needs  . Financial resource strain: Not on file  . Food insecurity:    Worry: Not on file    Inability: Not on file  . Transportation needs:    Medical: Not on file    Non-medical: Not on file  Tobacco Use  . Smoking status: Never Smoker  . Smokeless tobacco: Never Used  Substance and Sexual Activity  . Alcohol use: Yes    Alcohol/week: 6.0 standard drinks    Types: 6 Cans of beer per week    Comment: socially only  . Drug use: No  . Sexual activity: Not on file  Lifestyle  . Physical activity:    Days per week: Not on file    Minutes per session: Not on file  . Stress: Not on file  Relationships  . Social connections:    Talks on phone: Not on file    Gets  together: Not on file    Attends religious service: Not on file    Active member of club or organization: Not on file    Attends meetings of clubs or organizations: Not on file    Relationship status: Not on file  Other Topics Concern  . Not on file  Social History Narrative  . Not on file   Allergies  Allergen Reactions  . Fexofenadine-Pseudoephed Er     REACTION: headache   Family History  Problem Relation Age of Onset  . Hypertension Father   . Hyperlipidemia Father   . Transient ischemic attack Mother   . Diabetes Mother   . Hyperlipidemia Mother   . Heart attack Mother        2006; stents earely 60s  . Diabetes Brother   . Diabetes Maternal Grandmother   . Heart attack Maternal Grandmother        70s  . Throat cancer Maternal Grandfather        history of ETOH & smoking    Current Outpatient Medications (Endocrine & Metabolic):  .  predniSONE (DELTASONE) 10 MG tablet, 3 tabs by mouth per day for 3 days,2tabs per day for 3 days,1tab per day for  3 days  Current Outpatient Medications (Cardiovascular):  .  pravastatin (PRAVACHOL) 40 MG tablet, TAKE 1 TABLET BY MOUTH AT BEDTIME. .  tadalafil (CIALIS) 20 MG tablet, TAKE 1 TABLET BY MOUTH EVERY 3 DAYS AS NEEDED   Current Outpatient Medications (Analgesics):  .  aspirin 81 MG tablet,   .  traMADol (ULTRAM) 50 MG tablet, Take 1 tablet (50 mg total) by mouth every 6 (six) hours as needed for moderate pain. .  meloxicam (MOBIC) 15 MG tablet, Take 1 tablet (15 mg total) by mouth daily.   Current Outpatient Medications (Other):  .  gabapentin (NEURONTIN) 100 MG capsule, 1-2 tab by mouth three times daily .  LORazepam (ATIVAN) 0.5 MG tablet, Take 1 tablet (0.5 mg total) by mouth 2 (two) times daily as needed for anxiety. .  triamcinolone cream (KENALOG) 0.1 %, Apply 1 application topically 2 (two) times daily.    Past medical history, social, surgical and family history all reviewed in electronic medical record.  No  pertanent information unless stated regarding to the chief complaint.   Review of Systems:  No headache, visual changes, nausea, vomiting, diarrhea, constipation, dizziness, abdominal pain, skin rash, fevers, chills, night sweats, weight loss, swollen lymph nodes, body aches, joint swelling, , chest pain, shortness of breath, mood changes.  Positive muscle aches  Objective  Blood pressure (!) 142/72, pulse 81, height 5' 10.5" (1.791 m), weight 237 lb (107.5 kg), SpO2 96 %.    General: No apparent distress alert and oriented x3 mood and affect normal, dressed appropriately.  HEENT: Pupils equal, extraocular movements intact  Respiratory: Patient's speak in full sentences and does not appear short of breath  Cardiovascular: No lower extremity edema, non tender, no erythema  Skin: Warm dry intact with no signs of infection or rash on extremities or on axial skeleton.  Abdomen: Soft nontender  Neuro: Cranial nerves II through XII are intact, neurovascularly intact in all extremities with 2+ DTRs and 2+ pulses.  Lymph: No lymphadenopathy of posterior or anterior cervical chain or axillae bilaterally.  Gait normal with good balance and coordination.  MSK:  Non tender with full range of motion and good stability and symmetric strength and tone of shoulders, elbows, wrist, hip, knee and ankles bilaterally.  Back Exam:  Inspection: Unremarkable  Motion: Flexion 45 deg, Extension 25 deg, Side Bending to 35 deg bilaterally,  Rotation to 45 deg bilaterally  SLR laying: Negative  XSLR laying: Negative  Palpable tenderness: Tender to palpation of the right sacroiliac joint. FABER: Marikay Alar. Sensory change: Gross sensation intact to all lumbar and sacral dermatomes.  Reflexes: 2+ at both patellar tendons, 2+ at achilles tendons, Babinski's downgoing.  Strength at foot  Plantar-flexion: 5/5 Dorsi-flexion: 5/5 Eversion: 5/5 Inversion: 5/5  Leg strength  Quad: 5/5 Hamstring: 5/5 Hip flexor: 5/5 Hip  abductors: 5/5  Gait unremarkable.  [id number]; 15 additional minutes spent for Therapeutic exercises as stated in above notes.  This included exercises focusing on stretching, strengthening, with significant focus on eccentric aspects.   Long term goals include an improvement in range of motion, strength, endurance as well as avoiding reinjury. Patient's frequency would include in 1-2 times a day, 3-5 times a week for a duration of 6-12 weeks. Low back exercises that included:  Pelvic tilt/bracing instruction to focus on control of the pelvic girdle and lower abdominal muscles  Glute strengthening exercises, focusing on proper firing of the glutes without engaging the low back muscles Proper stretching techniques for maximum relief for  the hamstrings, hip flexors, low back and some rotation where tolerated   Proper technique shown and discussed handout in great detail with ATC.  All questions were discussed and answered.     Impression and Recommendations:     This case required medical decision making of moderate complexity. The above documentation has been reviewed and is accurate and complete Judi SaaZachary M , DO       Note: This dictation was prepared with Dragon dictation along with smaller phrase technology. Any transcriptional errors that result from this process are unintentional.

## 2018-03-01 NOTE — Assessment & Plan Note (Signed)
Patient has low back pain.  Discussed icing regimen and home exercises.  Discussed which activities to do which wants to avoid.  I believe the patient did have more of a radicular symptoms initially.  I do think that there was a possibility for herniated disc and encourage patient to continue the gabapentin at this point at night.  Meloxicam given for breakthrough.  Discussed icing regimen and home exercises.  Discussed topical anti-inflammatories.  Follow-up again 4 to 8 weeks.

## 2018-04-06 ENCOUNTER — Ambulatory Visit: Payer: BLUE CROSS/BLUE SHIELD | Admitting: Family Medicine

## 2018-04-22 ENCOUNTER — Encounter: Payer: BLUE CROSS/BLUE SHIELD | Admitting: Internal Medicine

## 2018-04-28 ENCOUNTER — Encounter: Payer: Self-pay | Admitting: Internal Medicine

## 2018-04-28 DIAGNOSIS — E7849 Other hyperlipidemia: Secondary | ICD-10-CM

## 2018-04-28 MED ORDER — PRAVASTATIN SODIUM 40 MG PO TABS: ORAL_TABLET | ORAL | 0 refills | Status: DC

## 2018-04-28 MED ORDER — TADALAFIL 20 MG PO TABS: ORAL_TABLET | ORAL | 11 refills | Status: DC

## 2018-04-28 NOTE — Telephone Encounter (Signed)
Pravastatin done erx  cialis done hardcopy to shirron to mail to pt please

## 2018-06-15 ENCOUNTER — Other Ambulatory Visit: Payer: Self-pay

## 2018-06-15 ENCOUNTER — Ambulatory Visit (INDEPENDENT_AMBULATORY_CARE_PROVIDER_SITE_OTHER): Payer: BLUE CROSS/BLUE SHIELD | Admitting: Internal Medicine

## 2018-06-15 ENCOUNTER — Encounter: Payer: Self-pay | Admitting: Internal Medicine

## 2018-06-15 ENCOUNTER — Other Ambulatory Visit (INDEPENDENT_AMBULATORY_CARE_PROVIDER_SITE_OTHER): Payer: BLUE CROSS/BLUE SHIELD

## 2018-06-15 VITALS — BP 124/82 | HR 88 | Temp 99.0°F | Ht 70.5 in | Wt 242.0 lb

## 2018-06-15 DIAGNOSIS — Z0000 Encounter for general adult medical examination without abnormal findings: Secondary | ICD-10-CM

## 2018-06-15 DIAGNOSIS — E7849 Other hyperlipidemia: Secondary | ICD-10-CM

## 2018-06-15 DIAGNOSIS — Z125 Encounter for screening for malignant neoplasm of prostate: Secondary | ICD-10-CM | POA: Diagnosis not present

## 2018-06-15 DIAGNOSIS — R739 Hyperglycemia, unspecified: Secondary | ICD-10-CM

## 2018-06-15 DIAGNOSIS — J309 Allergic rhinitis, unspecified: Secondary | ICD-10-CM

## 2018-06-15 LAB — PSA: PSA: 1.6 ng/mL (ref 0.10–4.00)

## 2018-06-15 LAB — CBC WITH DIFFERENTIAL/PLATELET
Basophils Absolute: 0 10*3/uL (ref 0.0–0.1)
Basophils Relative: 0.4 % (ref 0.0–3.0)
Eosinophils Absolute: 0.1 10*3/uL (ref 0.0–0.7)
Eosinophils Relative: 2.4 % (ref 0.0–5.0)
HCT: 43.6 % (ref 39.0–52.0)
Hemoglobin: 14.8 g/dL (ref 13.0–17.0)
Lymphocytes Relative: 31.9 % (ref 12.0–46.0)
Lymphs Abs: 1.4 10*3/uL (ref 0.7–4.0)
MCHC: 33.9 g/dL (ref 30.0–36.0)
MCV: 85.1 fl (ref 78.0–100.0)
Monocytes Absolute: 0.3 10*3/uL (ref 0.1–1.0)
Monocytes Relative: 6.8 % (ref 3.0–12.0)
Neutro Abs: 2.5 10*3/uL (ref 1.4–7.7)
Neutrophils Relative %: 58.5 % (ref 43.0–77.0)
Platelets: 201 10*3/uL (ref 150.0–400.0)
RBC: 5.13 Mil/uL (ref 4.22–5.81)
RDW: 13.6 % (ref 11.5–15.5)
WBC: 4.3 10*3/uL (ref 4.0–10.5)

## 2018-06-15 LAB — LIPID PANEL
CHOL/HDL RATIO: 4
Cholesterol: 179 mg/dL (ref 0–200)
HDL: 41.3 mg/dL (ref >39.00)
LDL Cholesterol: 99 mg/dL (ref 0–99)
NonHDL: 138
Triglycerides: 194 mg/dL — ABNORMAL HIGH (ref 0.0–149.0)
VLDL: 38.8 mg/dL (ref 0.0–40.0)

## 2018-06-15 LAB — URINALYSIS, ROUTINE W REFLEX MICROSCOPIC
BILIRUBIN URINE: NEGATIVE
Hgb urine dipstick: NEGATIVE
Ketones, ur: NEGATIVE
Leukocytes,Ua: NEGATIVE
Nitrite: NEGATIVE
Specific Gravity, Urine: >=1.03 — AB (ref 1.000–1.030)
Total Protein, Urine: NEGATIVE
Urine Glucose: NEGATIVE
Urobilinogen, UA: 0.2 (ref 0.0–1.0)
pH: 5.5 (ref 5.0–8.0)

## 2018-06-15 LAB — HEPATIC FUNCTION PANEL
ALT: 20 U/L (ref 0–53)
AST: 18 U/L (ref 0–37)
Albumin: 4.1 g/dL (ref 3.5–5.2)
Alkaline Phosphatase: 67 U/L (ref 39–117)
BILIRUBIN DIRECT: 0.1 mg/dL (ref 0.0–0.3)
TOTAL PROTEIN: 6.7 g/dL (ref 6.0–8.3)
Total Bilirubin: 0.4 mg/dL (ref 0.2–1.2)

## 2018-06-15 LAB — HEMOGLOBIN A1C: HEMOGLOBIN A1C: 6.3 % (ref 4.6–6.5)

## 2018-06-15 LAB — BASIC METABOLIC PANEL
BUN: 12 mg/dL (ref 6–23)
CO2: 28 mEq/L (ref 19–32)
Calcium: 9.1 mg/dL (ref 8.4–10.5)
Chloride: 107 mEq/L (ref 96–112)
Creatinine, Ser: 0.94 mg/dL (ref 0.40–1.50)
GFR: 84.96 mL/min (ref >60.00)
Glucose, Bld: 122 mg/dL — ABNORMAL HIGH (ref 70–99)
Potassium: 4.2 mEq/L (ref 3.5–5.1)
Sodium: 142 mEq/L (ref 135–145)

## 2018-06-15 LAB — TSH: TSH: 3.16 u[IU]/mL (ref 0.35–4.50)

## 2018-06-15 MED ORDER — PRAVASTATIN SODIUM 40 MG PO TABS: ORAL_TABLET | ORAL | 3 refills | Status: DC

## 2018-06-15 NOTE — Assessment & Plan Note (Signed)

## 2018-06-15 NOTE — Assessment & Plan Note (Signed)
stable overall by history and exam, recent data reviewed with pt, and pt to continue medical treatment as before,  to f/u any worsening symptoms or concerns, for a1c with labs 

## 2018-06-15 NOTE — Patient Instructions (Signed)
OK to try the OTC Nasacort as needed for allergy symptoms  Please continue all other medications as before, and refills have been done if requested.  Please have the pharmacy call with any other refills you may need.  Please continue your efforts at being more active, low cholesterol diet, and weight control.  You are otherwise up to date with prevention measures today.  Please keep your appointments with your specialists as you may have planned  You will be contacted regarding the referral for: colonoscopy  Please go to the LAB in the Basement (turn left off the elevator) for the tests to be done today  You will be contacted by phone if any changes need to be made immediately.  Otherwise, you will receive a letter about your results with an explanation, but please check with MyChart first.  Please remember to sign up for MyChart if you have not done so, as this will be important to you in the future with finding out test results, communicating by private email, and scheduling acute appointments online when needed.  Please return in 1 year for your yearly visit, or sooner if needed, with Lab testing done 3-5 days before

## 2018-06-15 NOTE — Assessment & Plan Note (Signed)
Ok for [employer],  to f/u any worsening symptoms or concerns

## 2018-06-15 NOTE — Progress Notes (Signed)
Subjective:    Patient ID: David Schneider, male    DOB: [birth year], 48 y.o.   MRN: [id number]  HPI  Here for wellness and f/u;  Overall doing ok;  Pt denies Chest pain, worsening SOB, DOE, wheezing, orthopnea, PND, worsening LE edema, palpitations, dizziness or syncope.  Pt denies neurological change such as new headache, facial or extremity weakness.  Pt denies polydipsia, polyuria, or low sugar symptoms. Pt states overall good compliance with treatment and medications, good tolerability, and has been trying to follow appropriate diet.  Pt denies worsening depressive symptoms, suicidal ideation or panic. No fever, night sweats, wt loss, loss of appetite, or other constitutional symptoms.  Pt states good ability with ADL's, has low fall risk, home safety reviewed and adequate, no other significant changes in hearing or vision, and only occasionally active with exercise.  Does have several wks ongoing nasal allergy symptoms with clearish congestion, itch and sneezing, without fever, pain, ST, cough, swelling or wheezing. Past Medical History:  Diagnosis Date  . Allergic rhinitis    seasonal  . ED (erectile dysfunction)   . Hyperlipidemia    Past Surgical History:  Procedure Laterality Date  . TONSILLECTOMY    . WISDOM TOOTH EXTRACTION      reports that he has never smoked. He has never used smokeless tobacco. He reports current alcohol use of about 6.0 standard drinks of alcohol per week. He reports that he does not use drugs. family history includes Diabetes in his brother, maternal grandmother, and mother; Heart attack in his maternal grandmother and mother; Hyperlipidemia in his father and mother; Hypertension in his father; Throat cancer in his maternal grandfather; Transient ischemic attack in his mother. Allergies  Allergen Reactions  . Fexofenadine-Pseudoephed Er     REACTION: headache   Current Outpatient Medications on File Prior to Visit  Medication Sig Dispense Refill  .  aspirin 81 MG tablet      . gabapentin (NEURONTIN) 100 MG capsule 1-2 tab by mouth three times daily 270 capsule 1  . LORazepam (ATIVAN) 0.5 MG tablet Take 1 tablet (0.5 mg total) by mouth 2 (two) times daily as needed for anxiety. 40 tablet 0  . meloxicam (MOBIC) 15 MG tablet Take 1 tablet (15 mg total) by mouth daily. 30 tablet 0  . tadalafil (CIALIS) 20 MG tablet TAKE 1 TABLET BY MOUTH EVERY 3 DAYS AS NEEDED 30 tablet 11  . traMADol (ULTRAM) 50 MG tablet Take 1 tablet (50 mg total) by mouth every 6 (six) hours as needed for moderate pain. 60 tablet 2  . triamcinolone cream (KENALOG) 0.1 % Apply 1 application topically 2 (two) times daily. 30 g 0   No current facility-administered medications on file prior to visit.    Review of Systems Constitutional: Negative for other unusual diaphoresis, sweats, appetite or weight changes HENT: Negative for other worsening hearing loss, ear pain, facial swelling, mouth sores or neck stiffness.   Eyes: Negative for other worsening pain, redness or other visual disturbance.  Respiratory: Negative for other stridor or swelling Cardiovascular: Negative for other palpitations or other chest pain  Gastrointestinal: Negative for worsening diarrhea or loose stools, blood in stool, distention or other pain Genitourinary: Negative for hematuria, flank pain or other change in urine volume.  Musculoskeletal: Negative for myalgias or other joint swelling.  Skin: Negative for other color change, or other wound or worsening drainage.  Neurological: Negative for other syncope or numbness. Hematological: Negative for other adenopathy or swelling Psychiatric/Behavioral: Negative  for hallucinations, other worsening agitation, SI, self-injury, or new decreased concentration All other system neg per pt    Objective:   Physical Exam BP 124/82   Pulse 88   Temp 99 F (37.2 C) (Oral)   Ht 5' 10.5" (1.791 m)   Wt 242 lb (109.8 kg)   SpO2 96%   BMI 34.23 kg/m  VS  noted,  Constitutional: Pt is oriented to person, place, and time. Appears well-developed and well-nourished, in no significant distress and comfortable Head: Normocephalic and atraumatic  Eyes: Conjunctivae and EOM are normal. Pupils are equal, round, and reactive to light Bilat tm's with mild erythema.  Max sinus areas non tender.  Pharynx with mild erythema, no exudate Right Ear: External ear normal without discharge Left Ear: External ear normal without discharge Nose: Nose without discharge or deformity Mouth/Throat: Oropharynx is without other ulcerations and moist  Neck: Normal range of motion. Neck supple. No JVD present. No tracheal deviation present or significant neck LA or mass Cardiovascular: Normal rate, regular rhythm, normal heart sounds and intact distal pulses.   Pulmonary/Chest: WOB normal and breath sounds without rales or wheezing  Abdominal: Soft. Bowel sounds are normal. NT. No HSM  Musculoskeletal: Normal range of motion. Exhibits no edema Lymphadenopathy: Has no other cervical adenopathy.  Neurological: Pt is alert and oriented to person, place, and time. Pt has normal reflexes. No cranial nerve deficit. Motor grossly intact, Gait intact Skin: Skin is warm and dry. No rash noted or new ulcerations Psychiatric:  Has normal mood and affect. Behavior is normal without agitation No other exam findings  Lab Results  Component Value Date   WBC 4.9 04/21/2017   HGB 15.4 04/21/2017   HCT 46.0 04/21/2017   PLT 226.0 04/21/2017   GLUCOSE 117 (H) 04/21/2017   CHOL 157 04/21/2017   TRIG 101.0 04/21/2017   HDL 40.70 04/21/2017   LDLDIRECT 157.4 06/23/2007   LDLCALC 96 04/21/2017   ALT 17 04/21/2017   AST 17 04/21/2017   NA 140 04/21/2017   K 4.6 04/21/2017   CL 104 04/21/2017   CREATININE 1.00 04/21/2017   BUN 11 04/21/2017   CO2 30 04/21/2017   TSH 2.60 04/21/2017   PSA 1.86 04/21/2017   HGBA1C 6.0 04/21/2017       Assessment & Plan:

## 2018-06-15 NOTE — Assessment & Plan Note (Signed)
stable overall by history and exam, recent data reviewed with pt, and pt to continue medical treatment as before,  to f/u any worsening symptoms or concerns, for f/u lipids 

## 2018-12-24 ENCOUNTER — Encounter: Payer: Self-pay | Admitting: Gastroenterology

## 2019-01-10 ENCOUNTER — Encounter: Payer: Self-pay | Admitting: Gastroenterology

## 2019-01-27 IMAGING — DX DG LUMBAR SPINE COMPLETE 4+V
5 series · 5 of 5 positions shown · non-contrast
Comparison: None.

CLINICAL DATA: 47-year-old male with lower back pain for 5 weeks.
No known injury. Initial encounter.

EXAM:
LUMBAR SPINE - COMPLETE 4+ VIEW

[l-spine ap]
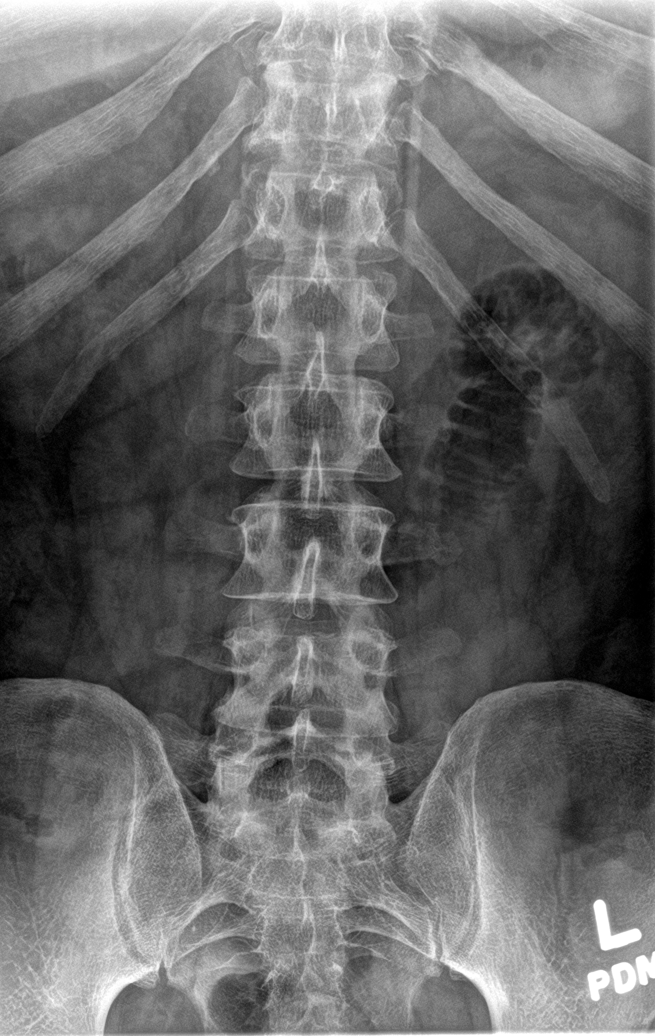

[l-spine obl (1 of 2)]
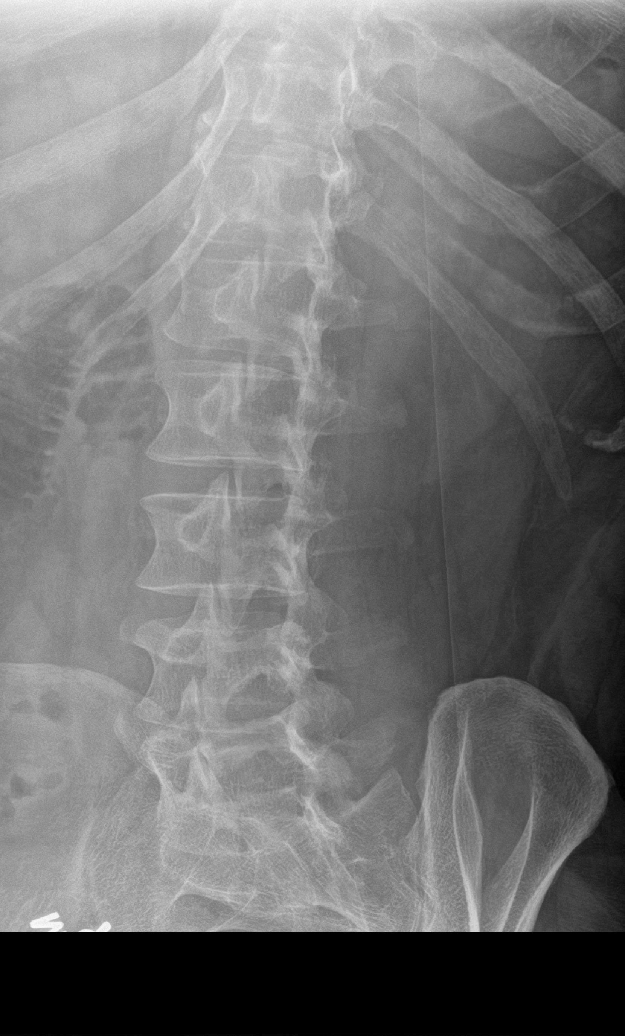

[l-spine obl (2 of 2)]
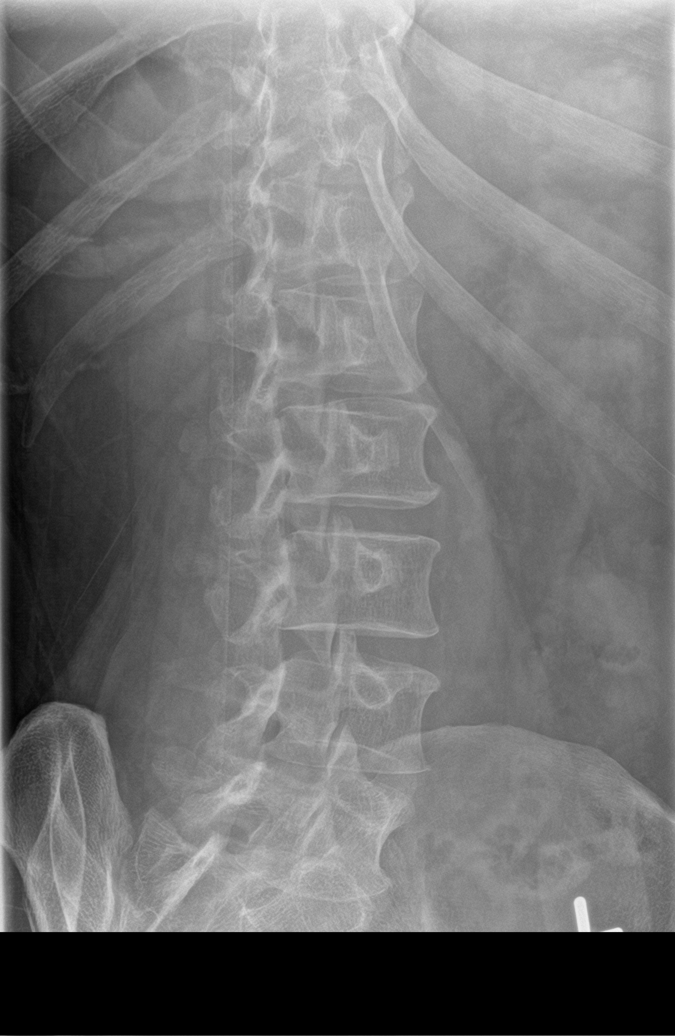

[l-spine lat]
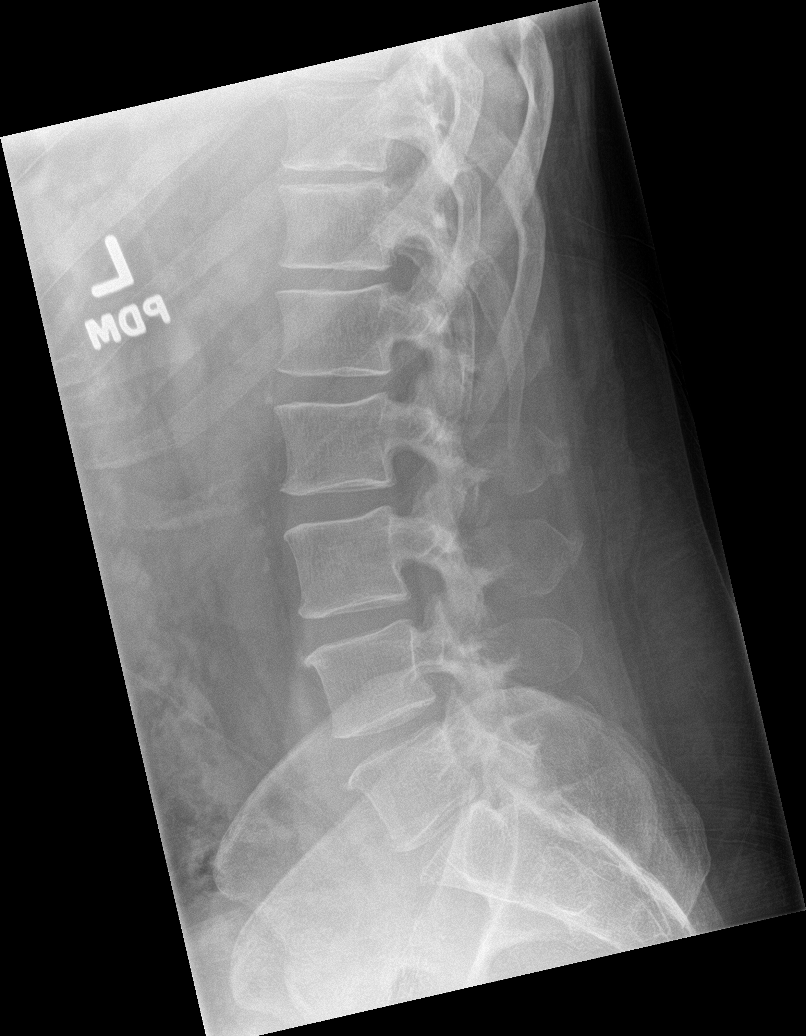

[l-spine spot]
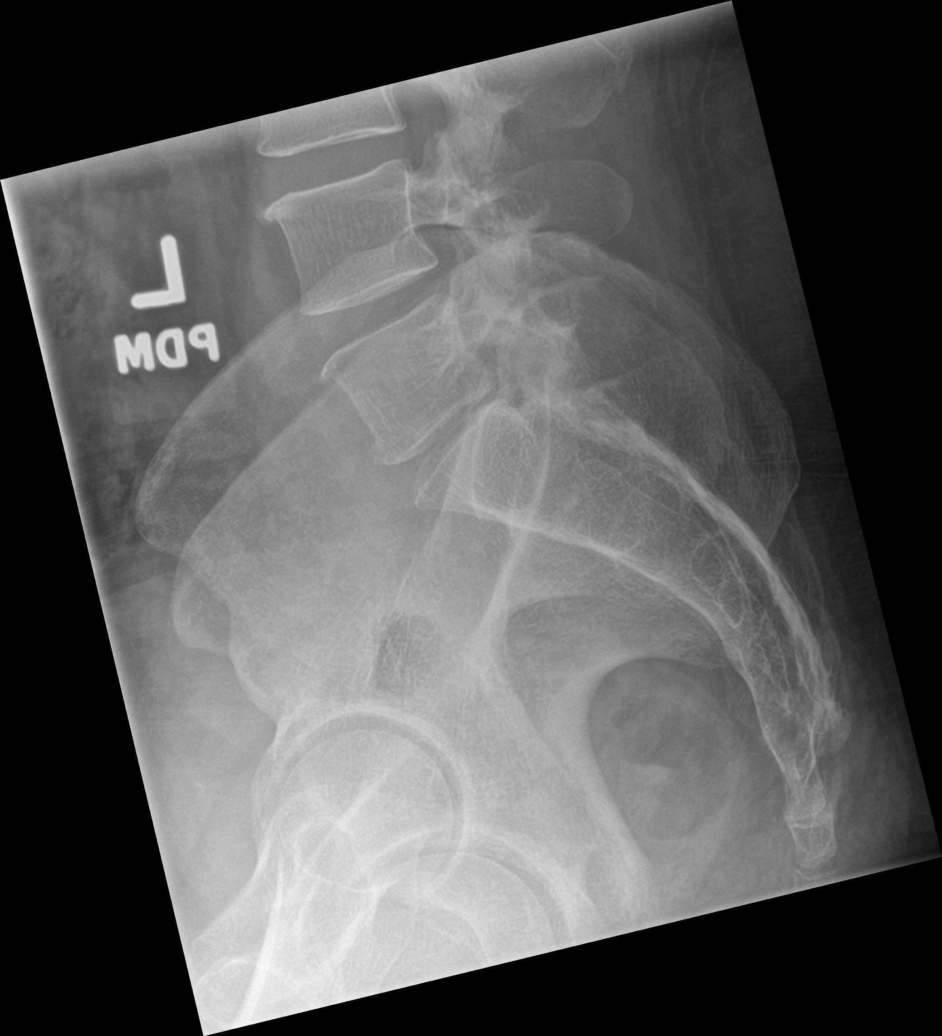

[5 of 5 positions shown; findings below may reference images not displayed]

FINDINGS: Normal alignment. No compression fracture or pars defect. Minimal
Schmorl's node deformity inferior endplate T11.

Mild L5-S1 and minimal L4-5 disc space narrowing.

Suspect vascular calcifications.

Sacroiliac joints appear intact.
IMPRESSION: 1. Mild L5-S1 and minimal L4-5 disc space narrowing.
2. Suspect vascular calcifications.

## 2019-02-01 ENCOUNTER — Encounter: Payer: Self-pay | Admitting: Gastroenterology

## 2019-02-01 ENCOUNTER — Other Ambulatory Visit: Payer: Self-pay

## 2019-02-01 ENCOUNTER — Ambulatory Visit (AMBULATORY_SURGERY_CENTER): Payer: Self-pay

## 2019-02-01 VITALS — Temp 96.9°F | Ht 70.5 in | Wt 242.0 lb

## 2019-02-01 DIAGNOSIS — Z1211 Encounter for screening for malignant neoplasm of colon: Secondary | ICD-10-CM

## 2019-02-01 NOTE — Progress Notes (Signed)
Denies allergies to eggs or soy products. Denies complication of anesthesia or sedation. Denies use of weight loss medication. Denies use of O2.   Emmi instructions given for colonoscopy.  Covid screening is scheduled for Tuesday 02/08/19 @ 8:30 Am.

## 2019-02-08 ENCOUNTER — Ambulatory Visit (INDEPENDENT_AMBULATORY_CARE_PROVIDER_SITE_OTHER): Payer: BLUE CROSS/BLUE SHIELD

## 2019-02-08 ENCOUNTER — Other Ambulatory Visit: Payer: Self-pay | Admitting: Gastroenterology

## 2019-02-08 DIAGNOSIS — Z1159 Encounter for screening for other viral diseases: Secondary | ICD-10-CM

## 2019-02-09 LAB — SARS CORONAVIRUS 2 (TAT 6-24 HRS): SARS Coronavirus 2: NEGATIVE

## 2019-02-11 ENCOUNTER — Other Ambulatory Visit: Payer: Self-pay

## 2019-02-11 ENCOUNTER — Encounter: Payer: Self-pay | Admitting: Gastroenterology

## 2019-02-11 ENCOUNTER — Ambulatory Visit (AMBULATORY_SURGERY_CENTER): Payer: BLUE CROSS/BLUE SHIELD | Admitting: Gastroenterology

## 2019-02-11 VITALS — BP 110/69 | HR 80 | Temp 98.5°F | Resp 12 | Ht 70.0 in | Wt 242.0 lb

## 2019-02-11 DIAGNOSIS — Z1211 Encounter for screening for malignant neoplasm of colon: Secondary | ICD-10-CM | POA: Diagnosis not present

## 2019-02-11 MED ORDER — SODIUM CHLORIDE 0.9 % IV SOLN: 500.0000 mL | Freq: Once | INTRAVENOUS | Status: DC

## 2019-02-11 NOTE — Progress Notes (Signed)
Pt's states no medical or surgical changes since previsit or office visit. VS by CW. Temp by JB 

## 2019-02-11 NOTE — Progress Notes (Signed)
A and O x3. Report to RN. Tolerated MAC anesthesia well.

## 2019-02-11 NOTE — Patient Instructions (Signed)
YOU HAD AN ENDOSCOPIC PROCEDURE TODAY AT THE [facility]:   Refer to the procedure report that was given to you for any specific questions about what was found during the examination.  If the procedure report does not answer your questions, please call your gastroenterologist to clarify.  If you requested that your care partner not be given the details of your procedure findings, then the procedure report has been included in a sealed envelope for you to review at your convenience later.  YOU SHOULD EXPECT: Some feelings of bloating in the abdomen. Passage of more gas than usual.  Walking can help get rid of the air that was put into your GI tract during the procedure and reduce the bloating. If you had a lower endoscopy (such as a colonoscopy or flexible sigmoidoscopy) you may notice spotting of blood in your stool or on the toilet paper. If you underwent a bowel prep for your procedure, you may not have a normal bowel movement for a few days.  Please Note:  You might notice some irritation and congestion in your nose or some drainage.  This is from the oxygen used during your procedure.  There is no need for concern and it should clear up in a day or so.  SYMPTOMS TO REPORT IMMEDIATELY:   Following lower endoscopy (colonoscopy or flexible sigmoidoscopy):  Excessive amounts of blood in the stool  Significant tenderness or worsening of abdominal pains  Swelling of the abdomen that is new, acute  Fever of 100F or higher  For urgent or emergent issues, a gastroenterologist can be reached at any hour by calling [phone].   DIET:  We do recommend a small meal at first, but then you may proceed to your regular diet.  Drink plenty of fluids but you should avoid alcoholic beverages for 24 hours.  ACTIVITY:  You should plan to take it easy for the rest of today and you should NOT DRIVE or use heavy machinery until tomorrow (because of the sedation medicines used during the test).     FOLLOW UP: Our staff will call the number listed on your records 48-72 hours following your procedure to check on you and address any questions or concerns that you may have regarding the information given to you following your procedure. If we do not reach you, we will leave a message.  We will attempt to reach you two times.  During this call, we will ask if you have developed any symptoms of COVID 19. If you develop any symptoms (ie: fever, flu-like symptoms, shortness of breath, cough etc.) before then, please call [phone].  If you test positive for Covid 19 in the 2 weeks post procedure, please call and report this information to us.    If any biopsies were taken you will be contacted by phone or by letter within the next 1-3 weeks.  Please call us at [phone] if you have not heard about the biopsies in 3 weeks.    SIGNATURES/CONFIDENTIALITY: You and/or your care partner have signed paperwork which will be entered into your electronic medical record.  These signatures attest to the fact that that the information above on your After Visit Summary has been reviewed and is understood.  Full responsibility of the confidentiality of this discharge information lies with you and/or your care-partner. 

## 2019-02-11 NOTE — Op Note (Signed)
[facility] Patient Name: David AranJoseph Bays Procedure Date: 02/11/2019 9:18 AM MRN: [id number] Endoscopist: Rachael Feeaniel P Jacobs , MD Age: 48 Referring MD:  Date of Birth: [birth year] Gender: Male Account #: [id number] Procedure:                Colonoscopy Indications:              Screening for colorectal malignant neoplasm Medicines:                Monitored Anesthesia Care Procedure:                Pre-Anesthesia Assessment:                           - Prior to the procedure, a History and Physical                            was performed, and patient medications and                            allergies were reviewed. The patient's tolerance of                            previous anesthesia was also reviewed. The risks                            and benefits of the procedure and the sedation                            options and risks were discussed with the patient.                            All questions were answered, and informed consent                            was obtained. Prior Anticoagulants: The patient has                            taken no previous anticoagulant or antiplatelet                            agents. ASA Grade Assessment: II - A patient with                            mild systemic disease. After reviewing the risks                            and benefits, the patient was deemed in                            satisfactory condition to undergo the procedure.                           After obtaining informed consent, the colonoscope  was passed under direct vision. Throughout the                            procedure, the patient's blood pressure, pulse, and                            oxygen saturations were monitored continuously. The                            Colonoscope was introduced through the anus and                            advanced to the the cecum, identified by                            appendiceal orifice and  ileocecal valve. The                            colonoscopy was performed without difficulty. The                            patient tolerated the procedure well. The quality                            of the bowel preparation was good. The ileocecal                            valve, appendiceal orifice, and rectum were                            photographed. Scope In: 9:20:59 AM Scope Out: 9:32:11 AM Scope Withdrawal Time: 0 hours 7 minutes 30 seconds  Total Procedure Duration: 0 hours 11 minutes 12 seconds  Findings:                 The entire examined colon appeared normal on direct                            and retroflexion views. Complications:            No immediate complications. Estimated blood loss:                            None. Estimated Blood Loss:     Estimated blood loss: none. Impression:               - The entire examined colon is normal on direct and                            retroflexion views.                           - No polyps or cancers. Recommendation:           - Patient has a contact number available for  emergencies. The signs and symptoms of potential                            delayed complications were discussed with the                            patient. Return to normal activities tomorrow.                            Written discharge instructions were provided to the                            patient.                           - Resume previous diet.                           - Continue present medications.                           - Repeat colonoscopy in 10 years for screening. Milus Banister, MD 02/11/2019 9:34:53 AM This report has been signed electronically.

## 2019-02-15 ENCOUNTER — Telehealth: Payer: Self-pay | Admitting: *Deleted

## 2019-02-15 NOTE — Telephone Encounter (Signed)
  Follow up Call-  Call back number 02/11/2019  Post procedure Call Back phone  # [phone]  Permission to leave phone message Yes  Some recent data might be hidden     Patient questions:  Do you have a fever, pain , or abdominal swelling? No. Pain Score  0 *  Have you tolerated food without any problems? Yes.    Have you been able to return to your normal activities? Yes.    Do you have any questions about your discharge instructions: Diet   No. Medications  No. Follow up visit  No.  Do you have questions or concerns about your Care? No.  Actions: * If pain score is 4 or above: No action needed, pain <4.  1. Have you developed a fever since your procedure? no  2.   Have you had an respiratory symptoms (SOB or cough) since your procedure? no  3.   Have you tested positive for COVID 19 since your procedure no  4.   Have you had any family members/close contacts diagnosed with the COVID 19 since your procedure?  no   If yes to any of these questions please route to Joylene John, RN and Alphonsa Gin, Therapist, sports.

## 2019-03-22 ENCOUNTER — Other Ambulatory Visit: Payer: Self-pay

## 2019-03-22 ENCOUNTER — Ambulatory Visit (INDEPENDENT_AMBULATORY_CARE_PROVIDER_SITE_OTHER): Payer: BLUE CROSS/BLUE SHIELD | Admitting: Internal Medicine

## 2019-03-22 ENCOUNTER — Encounter: Payer: Self-pay | Admitting: Internal Medicine

## 2019-03-22 VITALS — BP 118/82 | HR 82 | Temp 98.1°F | Resp 12 | Ht 71.0 in | Wt 244.0 lb

## 2019-03-22 DIAGNOSIS — R739 Hyperglycemia, unspecified: Secondary | ICD-10-CM

## 2019-03-22 DIAGNOSIS — E785 Hyperlipidemia, unspecified: Secondary | ICD-10-CM

## 2019-03-22 DIAGNOSIS — Z0000 Encounter for general adult medical examination without abnormal findings: Secondary | ICD-10-CM | POA: Diagnosis not present

## 2019-03-22 DIAGNOSIS — M25522 Pain in left elbow: Secondary | ICD-10-CM

## 2019-03-22 MED ORDER — MELOXICAM 15 MG PO TABS: 15.0000 mg | ORAL_TABLET | Freq: Every day | ORAL | 2 refills | Status: DC

## 2019-03-22 NOTE — Patient Instructions (Addendum)
Please take all new medication as prescribed - the anti inflammatory as needed  Please continue all other medications as before, and refills have been done if requested.  Please have the pharmacy call with any other refills you may need.  Please continue your efforts at being more active, low cholesterol diet, and weight control.  You are otherwise up to date with prevention measures today.  Please keep your appointments with your specialists as you may have planned  OK to stop wearing the left forarm brace for now  Please stop at the Sports Medicine office on the first floor for an appt for further evaluation to include the nerve issue  Please return in 3 months, or sooner if needed, with Lab testing done 3-5 days before

## 2019-03-22 NOTE — Progress Notes (Signed)
Subjective:    Patient ID: David Schneider, male    DOB: [birth year], 48 y.o.   MRN: [id number]  HPI here with 6 wks ongoing pain to the left elbow and tenderness, constant, moderate, throbbing at times, worse to use the hand and arm, not better with forearm band and may be getting worse; Pt denies chest pain, increased sob or doe, wheezing, orthopnea, PND, increased LE swelling, palpitations, dizziness or syncope.  Pt denies new neurological symptoms such as new headache, or facial or extremity weakness or numbness   Pt denies polydipsia, polyuria   Pt denies fever, wt loss, night sweats, loss of appetite, or other constitutional symptoms Past Medical History:  Diagnosis Date  . Allergic rhinitis    seasonal  . Allergy   . ED (erectile dysfunction)   . Hyperlipidemia    Past Surgical History:  Procedure Laterality Date  . TONSILLECTOMY    . WISDOM TOOTH EXTRACTION      reports that he has never smoked. He has never used smokeless tobacco. He reports current alcohol use of about 6.0 standard drinks of alcohol per week. He reports that he does not use drugs. family history includes Diabetes in his brother, maternal grandmother, and mother; Esophageal cancer in his maternal grandfather; Heart attack in his maternal grandmother and mother; Hyperlipidemia in his father and mother; Hypertension in his father; Throat cancer in his maternal grandfather; Transient ischemic attack in his mother. Allergies  Allergen Reactions  . Fexofenadine-Pseudoephed Er     REACTION: headache   Current Outpatient Medications on File Prior to Visit  Medication Sig Dispense Refill  . aspirin 81 MG tablet      . pravastatin (PRAVACHOL) 40 MG tablet TAKE 1 TABLET BY MOUTH AT BEDTIME. 90 tablet 3  . tadalafil (CIALIS) 20 MG tablet TAKE 1 TABLET BY MOUTH EVERY 3 DAYS AS NEEDED 30 tablet 11  . traMADol (ULTRAM) 50 MG tablet Take 1 tablet (50 mg total) by mouth every 6 (six) hours as needed for moderate pain. 60  tablet 2   No current facility-administered medications on file prior to visit.   Review of Systems  Constitutional: Negative for other unusual diaphoresis or sweats HENT: Negative for ear discharge or swelling Eyes: Negative for other worsening visual disturbances Respiratory: Negative for stridor or other swelling  Gastrointestinal: Negative for worsening distension or other blood Genitourinary: Negative for retention or other urinary change Musculoskeletal: Negative for other MSK pain or swelling Skin: Negative for color change or other new lesions Neurological: Negative for worsening tremors and other numbness  Psychiatric/Behavioral: Negative for worsening agitation or other fatigue All otherwise neg per pt     Objective:   Physical Exam BP 118/82 (BP Location: Left Arm, Patient Position: Sitting, Cuff Size: Normal)   Pulse 82   Temp 98.1 F (36.7 C)   Resp 12   Ht 5\' 11"  (1.803 m)   Wt 244 lb (110.7 kg)   SpO2 98%   BMI 34.03 kg/m  VS noted,  Constitutional: Pt appears in NAD HENT: Head: NCAT.  Right Ear: External ear normal.  Left Ear: External ear normal.  Eyes: . Pupils are equal, round, and reactive to light. Conjunctivae and EOM are normal Nose: without d/c or deformity Neck: Neck supple. Gross normal ROM Cardiovascular: Normal rate and regular rhythm.   Pulmonary/Chest: Effort normal and breath sounds without rales or wheezing.  Left elbow with tender lateral epicondylar area Neurological: Pt is alert. At baseline orientation, motor grossly intact  Skin: Skin is warm. No rashes, other new lesions, no LE edema Psychiatric: Pt behavior is normal without agitation  All otherwise neg per pt Lab Results  Component Value Date   WBC 4.3 06/15/2018   HGB 14.8 06/15/2018   HCT 43.6 06/15/2018   PLT 201.0 06/15/2018   GLUCOSE 122 (H) 06/15/2018   CHOL 179 06/15/2018   TRIG 194.0 (H) 06/15/2018   HDL 41.30 06/15/2018   LDLDIRECT 157.4 06/23/2007   LDLCALC 99  06/15/2018   ALT 20 06/15/2018   AST 18 06/15/2018   NA 142 06/15/2018   K 4.2 06/15/2018   CL 107 06/15/2018   CREATININE 0.94 06/15/2018   BUN 12 06/15/2018   CO2 28 06/15/2018   TSH 3.16 06/15/2018   PSA 1.60 06/15/2018   HGBA1C 6.3 06/15/2018         Assessment & Plan:

## 2019-03-24 DIAGNOSIS — Z20828 Contact with and (suspected) exposure to other viral communicable diseases: Secondary | ICD-10-CM | POA: Diagnosis not present

## 2019-03-24 DIAGNOSIS — R430 Anosmia: Secondary | ICD-10-CM | POA: Diagnosis not present

## 2019-03-24 DIAGNOSIS — R439 Unspecified disturbances of smell and taste: Secondary | ICD-10-CM | POA: Diagnosis not present

## 2019-04-03 ENCOUNTER — Encounter: Payer: Self-pay | Admitting: Internal Medicine

## 2019-04-03 DIAGNOSIS — M25522 Pain in left elbow: Secondary | ICD-10-CM | POA: Insufficient documentation

## 2019-04-03 NOTE — Assessment & Plan Note (Signed)
stable overall by history and exam, recent data reviewed with pt, and pt to continue medical treatment as before,  to f/u any worsening symptoms or concerns  

## 2019-04-03 NOTE — Assessment & Plan Note (Signed)
C/w possible lateral epicondylitis, but cant r/o neuritic pain as well, for mobic prn and refer to sports medicine for likely u/s eval

## 2019-04-03 NOTE — Addendum Note (Signed)
Addended by: Corwin Levins on: 04/03/2019 05:09 AM   Modules accepted: Orders

## 2019-04-08 ENCOUNTER — Ambulatory Visit: Payer: BLUE CROSS/BLUE SHIELD | Admitting: Family Medicine

## 2019-05-30 ENCOUNTER — Telehealth: Payer: Self-pay

## 2019-05-30 NOTE — Telephone Encounter (Signed)
New message    The patient verbalized he seen Dr. Jonny Ruiz 03/22/19 at that visit anti-inflammatory medication was prescribed for his elbow.   The patient was to go to sports medicine for an x-ray on  1.8.2021 appt cancel due to positive COVID.    C/o elbow still hurting and X-ray needs to be schedule.    Please advise

## 2019-05-30 NOTE — Telephone Encounter (Signed)
Spoke with patient, he is going to come in to get the xray that was never previously performed. Patient is still having elbow pain

## 2019-05-31 ENCOUNTER — Other Ambulatory Visit: Payer: Self-pay | Admitting: Internal Medicine

## 2019-05-31 ENCOUNTER — Other Ambulatory Visit: Payer: Self-pay

## 2019-05-31 ENCOUNTER — Ambulatory Visit (INDEPENDENT_AMBULATORY_CARE_PROVIDER_SITE_OTHER)
Admission: RE | Admit: 2019-05-31 | Discharge: 2019-05-31 | Disposition: A | Discharge status: Home / Self Care | Payer: BLUE CROSS/BLUE SHIELD | Source: Ambulatory Visit | Attending: Internal Medicine | Admitting: Internal Medicine

## 2019-05-31 DIAGNOSIS — M25522 Pain in left elbow: Secondary | ICD-10-CM

## 2019-06-01 ENCOUNTER — Encounter: Payer: Self-pay | Admitting: Internal Medicine

## 2019-06-16 ENCOUNTER — Encounter: Payer: BLUE CROSS/BLUE SHIELD | Admitting: Internal Medicine

## 2019-08-16 ENCOUNTER — Other Ambulatory Visit: Payer: Self-pay | Admitting: Internal Medicine

## 2019-08-16 ENCOUNTER — Encounter: Payer: Self-pay | Admitting: Internal Medicine

## 2019-08-16 ENCOUNTER — Ambulatory Visit (INDEPENDENT_AMBULATORY_CARE_PROVIDER_SITE_OTHER): Payer: BLUE CROSS/BLUE SHIELD | Admitting: Internal Medicine

## 2019-08-16 ENCOUNTER — Other Ambulatory Visit: Payer: Self-pay

## 2019-08-16 VITALS — BP 160/86 | HR 87 | Temp 98.0°F | Ht 71.0 in | Wt 236.0 lb

## 2019-08-16 DIAGNOSIS — R030 Elevated blood-pressure reading, without diagnosis of hypertension: Secondary | ICD-10-CM

## 2019-08-16 DIAGNOSIS — R739 Hyperglycemia, unspecified: Secondary | ICD-10-CM | POA: Diagnosis not present

## 2019-08-16 DIAGNOSIS — E7849 Other hyperlipidemia: Secondary | ICD-10-CM | POA: Diagnosis not present

## 2019-08-16 DIAGNOSIS — Z125 Encounter for screening for malignant neoplasm of prostate: Secondary | ICD-10-CM | POA: Diagnosis not present

## 2019-08-16 DIAGNOSIS — E538 Deficiency of other specified B group vitamins: Secondary | ICD-10-CM | POA: Diagnosis not present

## 2019-08-16 DIAGNOSIS — Z0000 Encounter for general adult medical examination without abnormal findings: Secondary | ICD-10-CM | POA: Diagnosis not present

## 2019-08-16 DIAGNOSIS — E559 Vitamin D deficiency, unspecified: Secondary | ICD-10-CM

## 2019-08-16 LAB — URINALYSIS, ROUTINE W REFLEX MICROSCOPIC
Bilirubin Urine: NEGATIVE
Hgb urine dipstick: NEGATIVE
Ketones, ur: NEGATIVE
Leukocytes,Ua: NEGATIVE
Nitrite: NEGATIVE
Specific Gravity, Urine: 1.025 (ref 1.000–1.030)
Total Protein, Urine: NEGATIVE
Urine Glucose: NEGATIVE
Urobilinogen, UA: 0.2 (ref 0.0–1.0)
pH: 5.5 (ref 5.0–8.0)

## 2019-08-16 LAB — BASIC METABOLIC PANEL
BUN: 14 mg/dL (ref 6–23)
CO2: 29 mEq/L (ref 19–32)
Calcium: 9.1 mg/dL (ref 8.4–10.5)
Chloride: 104 mEq/L (ref 96–112)
Creatinine, Ser: 0.88 mg/dL (ref 0.40–1.50)
GFR: 91.25 mL/min (ref >60.00)
Glucose, Bld: 121 mg/dL — ABNORMAL HIGH (ref 70–99)
Potassium: 3.9 mEq/L (ref 3.5–5.1)
Sodium: 139 mEq/L (ref 135–145)

## 2019-08-16 LAB — HEPATIC FUNCTION PANEL
ALT: 20 U/L (ref 0–53)
AST: 21 U/L (ref 0–37)
Albumin: 4.3 g/dL (ref 3.5–5.2)
Alkaline Phosphatase: 66 U/L (ref 39–117)
Bilirubin, Direct: 0.1 mg/dL (ref 0.0–0.3)
Total Bilirubin: 0.4 mg/dL (ref 0.2–1.2)
Total Protein: 6.8 g/dL (ref 6.0–8.3)

## 2019-08-16 LAB — TSH: TSH: 1.65 u[IU]/mL (ref 0.35–4.50)

## 2019-08-16 LAB — CBC WITH DIFFERENTIAL/PLATELET
Basophils Absolute: 0 10*3/uL (ref 0.0–0.1)
Basophils Relative: 0.7 % (ref 0.0–3.0)
Eosinophils Absolute: 0.1 10*3/uL (ref 0.0–0.7)
Eosinophils Relative: 2.4 % (ref 0.0–5.0)
HCT: 41.6 % (ref 39.0–52.0)
Hemoglobin: 14.1 g/dL (ref 13.0–17.0)
Lymphocytes Relative: 21.4 % (ref 12.0–46.0)
Lymphs Abs: 1.2 10*3/uL (ref 0.7–4.0)
MCHC: 33.8 g/dL (ref 30.0–36.0)
MCV: 85.4 fl (ref 78.0–100.0)
Monocytes Absolute: 0.5 10*3/uL (ref 0.1–1.0)
Monocytes Relative: 8.4 % (ref 3.0–12.0)
Neutro Abs: 3.6 10*3/uL (ref 1.4–7.7)
Neutrophils Relative %: 67.1 % (ref 43.0–77.0)
Platelets: 212 10*3/uL (ref 150.0–400.0)
RBC: 4.87 Mil/uL (ref 4.22–5.81)
RDW: 13.9 % (ref 11.5–15.5)
WBC: 5.4 10*3/uL (ref 4.0–10.5)

## 2019-08-16 LAB — LIPID PANEL
Cholesterol: 164 mg/dL (ref 0–200)
HDL: 42.6 mg/dL (ref >39.00)
LDL Cholesterol: 104 mg/dL — ABNORMAL HIGH (ref 0–99)
NonHDL: 120.99
Total CHOL/HDL Ratio: 4
Triglycerides: 86 mg/dL (ref 0.0–149.0)
VLDL: 17.2 mg/dL (ref 0.0–40.0)

## 2019-08-16 LAB — PSA: PSA: 1.88 ng/mL (ref 0.10–4.00)

## 2019-08-16 LAB — VITAMIN B12: Vitamin B-12: 158 pg/mL — ABNORMAL LOW (ref 211–911)

## 2019-08-16 LAB — VITAMIN D 25 HYDROXY (VIT D DEFICIENCY, FRACTURES): VITD: 36.19 ng/mL (ref 30.00–100.00)

## 2019-08-16 LAB — HEMOGLOBIN A1C: Hgb A1c MFr Bld: 6 % (ref 4.6–6.5)

## 2019-08-16 MED ORDER — VITAMIN B-12 1000 MCG PO TABS
1000.0000 ug | ORAL_TABLET | Freq: Every day | ORAL | 3 refills | Status: DC
Start: 2019-08-16 — End: 2020-08-22

## 2019-08-16 MED ORDER — TADALAFIL 20 MG PO TABS: ORAL_TABLET | ORAL | 11 refills | Status: DC

## 2019-08-16 MED ORDER — MELOXICAM 15 MG PO TABS: 15.0000 mg | ORAL_TABLET | Freq: Every day | ORAL | 5 refills | Status: DC | PRN

## 2019-08-16 MED ORDER — PRAVASTATIN SODIUM 40 MG PO TABS: ORAL_TABLET | ORAL | 3 refills | Status: DC

## 2019-08-16 NOTE — Assessment & Plan Note (Signed)
Likely reactive, for f/u bp at home and next visit

## 2019-08-16 NOTE — Assessment & Plan Note (Signed)
stable overall by history and exam, recent data reviewed with pt, and pt to continue medical treatment as before,  to f/u any worsening symptoms or concerns  

## 2019-08-16 NOTE — Addendum Note (Signed)
Addended by: Merrilyn Puma on: 08/16/2019 08:54 AM   Modules accepted: Orders

## 2019-08-16 NOTE — Assessment & Plan Note (Signed)

## 2019-08-16 NOTE — Progress Notes (Signed)
Subjective:    Patient ID: David Schneider, male    DOB: 29-Mar-1971, 49 y.o.   MRN: [id number]  HPI  Here for wellness and f/u;  Overall doing ok;  Pt denies Chest pain, worsening SOB, DOE, wheezing, orthopnea, PND, worsening LE edema, palpitations, dizziness or syncope.  Pt denies neurological change such as new headache, facial or extremity weakness.  Pt denies polydipsia, polyuria, or low sugar symptoms. Pt states overall good compliance with treatment and medications, good tolerability, and has been trying to follow appropriate diet.  Pt denies worsening depressive symptoms, suicidal ideation or panic. No fever, night sweats, wt loss, loss of appetite, or other constitutional symptoms.  Pt states good ability with ADL's, has low fall risk, home safety reviewed and adequate, no other significant changes in hearing or vision, and only occasionally active with exercise. Sp covid infection dec 2020.   Had call from work prior to getting here with upset.  Wt has been improved with better diet.   BP Readings from Last 3 Encounters:  08/16/19 (!) 160/86  03/22/19 118/82  02/11/19 110/69   Wt Readings from Last 3 Encounters:  08/16/19 236 lb (107 kg)  03/22/19 244 lb (110.7 kg)  02/11/19 242 lb (109.8 kg)   Past Medical History:  Diagnosis Date  . Allergic rhinitis    seasonal  . Allergy   . ED (erectile dysfunction)   . Hyperlipidemia    Past Surgical History:  Procedure Laterality Date  . TONSILLECTOMY    . WISDOM TOOTH EXTRACTION      reports that he has never smoked. He has never used smokeless tobacco. He reports current alcohol use of about 6.0 standard drinks of alcohol per week. He reports that he does not use drugs. family history includes Diabetes in his brother, maternal grandmother, and mother; Esophageal cancer in his maternal grandfather; Heart attack in his maternal grandmother and mother; Hyperlipidemia in his father and mother; Hypertension in his father; Throat cancer  in his maternal grandfather; Transient ischemic attack in his mother. Allergies  Allergen Reactions  . Fexofenadine-Pseudoephed Er     REACTION: headache   Current Outpatient Medications on File Prior to Visit  Medication Sig Dispense Refill  . aspirin 81 MG tablet       No current facility-administered medications on file prior to visit.   Review of Systems All otherwise neg per pt     Objective:   Physical Exam  BP (!) 160/86 (BP Location: Left Arm, Patient Position: Sitting, Cuff Size: Large)   Pulse 87   Temp 98 F (36.7 C) (Oral)   Ht 5\' 11"  (1.803 m)   Wt 236 lb (107 kg)   SpO2 97%   BMI 32.92 kg/m  VS noted,  Constitutional: Pt appears in NAD HENT: Head: NCAT.  Right Ear: External ear normal.  Left Ear: External ear normal.  Eyes: . Pupils are equal, round, and reactive to light. Conjunctivae and EOM are normal Nose: without d/c or deformity Neck: Neck supple. Gross normal ROM Cardiovascular: Normal rate and regular rhythm.   Pulmonary/Chest: Effort normal and breath sounds without rales or wheezing.  Abd:  Soft, NT, ND, + BS, no organomegaly Neurological: Pt is alert. At baseline orientation, motor grossly intact Skin: Skin is warm. No rashes, other new lesions, no LE edema Psychiatric: Pt behavior is normal without agitation  All otherwise neg per pt Lab Results  Component Value Date   WBC 4.3 06/15/2018   HGB 14.8 06/15/2018  HCT 43.6 06/15/2018   PLT 201.0 06/15/2018   GLUCOSE 122 (H) 06/15/2018   CHOL 179 06/15/2018   TRIG 194.0 (H) 06/15/2018   HDL 41.30 06/15/2018   LDLDIRECT 157.4 06/23/2007   LDLCALC 99 06/15/2018   ALT 20 06/15/2018   AST 18 06/15/2018   NA 142 06/15/2018   K 4.2 06/15/2018   CL 107 06/15/2018   CREATININE 0.94 06/15/2018   BUN 12 06/15/2018   CO2 28 06/15/2018   TSH 3.16 06/15/2018   PSA 1.60 06/15/2018   HGBA1C 6.3 06/15/2018       Assessment & Plan:

## 2019-08-16 NOTE — Patient Instructions (Signed)

## 2019-08-16 NOTE — Addendum Note (Signed)
Addended by: Corwin Levins on: 08/16/2019 10:15 PM   Modules accepted: Orders

## 2019-10-12 ENCOUNTER — Telehealth: Payer: Self-pay

## 2019-10-12 NOTE — Telephone Encounter (Deleted)
Error

## 2020-08-09 ENCOUNTER — Other Ambulatory Visit: Payer: BLUE CROSS/BLUE SHIELD

## 2020-08-16 ENCOUNTER — Encounter: Payer: BLUE CROSS/BLUE SHIELD | Admitting: Internal Medicine

## 2020-08-22 ENCOUNTER — Other Ambulatory Visit: Payer: Self-pay | Admitting: Internal Medicine

## 2020-09-08 ENCOUNTER — Other Ambulatory Visit: Payer: Self-pay | Admitting: Internal Medicine

## 2020-09-08 DIAGNOSIS — E7849 Other hyperlipidemia: Secondary | ICD-10-CM

## 2020-09-09 NOTE — Telephone Encounter (Signed)
Please to contact pt  One mo refill done for pravastatin and cialis  Please to make rov for further refills

## 2020-09-27 ENCOUNTER — Other Ambulatory Visit (INDEPENDENT_AMBULATORY_CARE_PROVIDER_SITE_OTHER): Payer: BC Managed Care – PPO

## 2020-09-27 DIAGNOSIS — Z0000 Encounter for general adult medical examination without abnormal findings: Secondary | ICD-10-CM

## 2020-09-27 DIAGNOSIS — R739 Hyperglycemia, unspecified: Secondary | ICD-10-CM

## 2020-09-27 LAB — CBC WITH DIFFERENTIAL/PLATELET
Basophils Absolute: 0 10*3/uL (ref 0.0–0.1)
Basophils Relative: 0.6 % (ref 0.0–3.0)
Eosinophils Absolute: 0.2 10*3/uL (ref 0.0–0.7)
Eosinophils Relative: 3 % (ref 0.0–5.0)
HCT: 44 % (ref 39.0–52.0)
Hemoglobin: 14.7 g/dL (ref 13.0–17.0)
Lymphocytes Relative: 28.5 % (ref 12.0–46.0)
Lymphs Abs: 1.5 10*3/uL (ref 0.7–4.0)
MCHC: 33.5 g/dL (ref 30.0–36.0)
MCV: 82.7 fl (ref 78.0–100.0)
Monocytes Absolute: 0.6 10*3/uL (ref 0.1–1.0)
Monocytes Relative: 10.7 % (ref 3.0–12.0)
Neutro Abs: 3.1 10*3/uL (ref 1.4–7.7)
Neutrophils Relative %: 57.2 % (ref 43.0–77.0)
Platelets: 204 10*3/uL (ref 150.0–400.0)
RBC: 5.32 Mil/uL (ref 4.22–5.81)
RDW: 15 % (ref 11.5–15.5)
WBC: 5.3 10*3/uL (ref 4.0–10.5)

## 2020-09-27 LAB — HEMOGLOBIN A1C: Hgb A1c MFr Bld: 6.6 % — ABNORMAL HIGH (ref 4.6–6.5)

## 2020-09-27 LAB — URINALYSIS, ROUTINE W REFLEX MICROSCOPIC
Bilirubin Urine: NEGATIVE
Hgb urine dipstick: NEGATIVE
Ketones, ur: NEGATIVE
Leukocytes,Ua: NEGATIVE
Nitrite: NEGATIVE
RBC / HPF: NONE SEEN (ref 0–?)
Specific Gravity, Urine: >=1.03 — AB (ref 1.000–1.030)
Total Protein, Urine: NEGATIVE
Urine Glucose: NEGATIVE
Urobilinogen, UA: 0.2 (ref 0.0–1.0)
WBC, UA: NONE SEEN (ref 0–?)
pH: 6 (ref 5.0–8.0)

## 2020-09-27 LAB — HEPATIC FUNCTION PANEL
ALT: 20 U/L (ref 0–53)
AST: 18 U/L (ref 0–37)
Albumin: 4.2 g/dL (ref 3.5–5.2)
Alkaline Phosphatase: 70 U/L (ref 39–117)
Bilirubin, Direct: 0.1 mg/dL (ref 0.0–0.3)
Total Bilirubin: 0.5 mg/dL (ref 0.2–1.2)
Total Protein: 6.9 g/dL (ref 6.0–8.3)

## 2020-09-27 LAB — LIPID PANEL
Cholesterol: 152 mg/dL (ref 0–200)
HDL: 40.6 mg/dL (ref >39.00)
LDL Cholesterol: 83 mg/dL (ref 0–99)
NonHDL: 111.09
Total CHOL/HDL Ratio: 4
Triglycerides: 140 mg/dL (ref 0.0–149.0)
VLDL: 28 mg/dL (ref 0.0–40.0)

## 2020-09-27 LAB — BASIC METABOLIC PANEL
BUN: 14 mg/dL (ref 6–23)
CO2: 27 mEq/L (ref 19–32)
Calcium: 9.4 mg/dL (ref 8.4–10.5)
Chloride: 105 mEq/L (ref 96–112)
Creatinine, Ser: 0.89 mg/dL (ref 0.40–1.50)
GFR: 98.71 mL/min (ref >60.00)
Glucose, Bld: 110 mg/dL — ABNORMAL HIGH (ref 70–99)
Potassium: 4.3 mEq/L (ref 3.5–5.1)
Sodium: 140 mEq/L (ref 135–145)

## 2020-09-27 LAB — PSA: PSA: 2 ng/mL (ref 0.10–4.00)

## 2020-09-27 LAB — TSH: TSH: 2.13 u[IU]/mL (ref 0.35–5.50)

## 2020-10-04 ENCOUNTER — Other Ambulatory Visit: Payer: Self-pay

## 2020-10-10 ENCOUNTER — Other Ambulatory Visit: Payer: Self-pay

## 2020-10-11 ENCOUNTER — Encounter: Payer: Self-pay | Admitting: Internal Medicine

## 2020-10-11 ENCOUNTER — Other Ambulatory Visit: Payer: Self-pay

## 2020-10-11 ENCOUNTER — Ambulatory Visit (INDEPENDENT_AMBULATORY_CARE_PROVIDER_SITE_OTHER): Payer: BC Managed Care – PPO | Admitting: Internal Medicine

## 2020-10-11 VITALS — BP 126/70 | HR 76 | Temp 98.3°F | Ht 71.0 in | Wt 240.0 lb

## 2020-10-11 DIAGNOSIS — Z0000 Encounter for general adult medical examination without abnormal findings: Secondary | ICD-10-CM | POA: Diagnosis not present

## 2020-10-11 DIAGNOSIS — E7849 Other hyperlipidemia: Secondary | ICD-10-CM

## 2020-10-11 DIAGNOSIS — E559 Vitamin D deficiency, unspecified: Secondary | ICD-10-CM

## 2020-10-11 DIAGNOSIS — R739 Hyperglycemia, unspecified: Secondary | ICD-10-CM

## 2020-10-11 DIAGNOSIS — F528 Other sexual dysfunction not due to a substance or known physiological condition: Secondary | ICD-10-CM

## 2020-10-11 DIAGNOSIS — R030 Elevated blood-pressure reading, without diagnosis of hypertension: Secondary | ICD-10-CM

## 2020-10-11 MED ORDER — METFORMIN HCL ER 500 MG PO TB24: 500.0000 mg | ORAL_TABLET | Freq: Every day | ORAL | 3 refills | Status: DC

## 2020-10-11 MED ORDER — VITAMIN B-12 1000 MCG PO TABS: 1000.0000 ug | ORAL_TABLET | Freq: Every day | ORAL | 99 refills | Status: DC

## 2020-10-11 MED ORDER — ZOSTER VAC RECOMB ADJUVANTED 50 MCG/0.5ML IM SUSR: 0.5000 mL | Freq: Once | INTRAMUSCULAR | 1 refills | Status: AC

## 2020-10-11 MED ORDER — MELOXICAM 15 MG PO TABS: 15.0000 mg | ORAL_TABLET | Freq: Every day | ORAL | 3 refills | Status: DC | PRN

## 2020-10-11 MED ORDER — SILDENAFIL CITRATE 100 MG PO TABS: 50.0000 mg | ORAL_TABLET | Freq: Every day | ORAL | 11 refills | Status: DC | PRN

## 2020-10-11 MED ORDER — PRAVASTATIN SODIUM 40 MG PO TABS: ORAL_TABLET | ORAL | 3 refills | Status: DC

## 2020-10-11 NOTE — Progress Notes (Signed)
Patient ID: David Schneider, male   DOB: 03/19/1971, 50 y.o.   MRN: [id number]         Chief Complaint:: wellness exam and ED, DM, HLD       HPI:  David Schneider is a 49 y.o. male here for wellness exam; declines covid booster, shingrix, tdap and hep c screen for now, ow up to date with preventive referrals and immunizations                        Also s/p covid infection about xmas 2020.   Has worsening Ed and asks for change of cialis.   Pt denies polydipsia, polyuria, or focal neuro s/s.  Pt denies chest pain, increased sob or doe, wheezing, orthopnea, PND, increased LE swelling, palpitations, dizziness or syncope.  No other new complaints   Wt Readings from Last 3 Encounters:  10/11/20 240 lb (108.9 kg)  08/16/19 236 lb (107 kg)  03/22/19 244 lb (110.7 kg)   BP Readings from Last 3 Encounters:  10/11/20 126/70  08/16/19 (!) 160/86  03/22/19 118/82   Immunization History  Administered Date(s) Administered   PFIZER(Purple Top)SARS-COV-2 Vaccination 05/30/2019, 06/20/2019, 03/30/2020   Td 09/13/2009   There are no preventive care reminders to display for this patient.     Past Medical History:  Diagnosis Date   Allergic rhinitis    seasonal   Allergy    ED (erectile dysfunction)    Hyperlipidemia    Past Surgical History:  Procedure Laterality Date   TONSILLECTOMY     WISDOM TOOTH EXTRACTION      reports that he has never smoked. He has never used smokeless tobacco. He reports current alcohol use of about 6.0 standard drinks of alcohol per week. He reports that he does not use drugs. family history includes Diabetes in his brother, maternal grandmother, and mother; Esophageal cancer in his maternal grandfather; Heart attack in his maternal grandmother and mother; Hyperlipidemia in his father and mother; Hypertension in his father; Throat cancer in his maternal grandfather; Transient ischemic attack in his mother. Allergies  Allergen Reactions    Fexofenadine-Pseudoephed Er     REACTION: headache   Current Outpatient Medications on File Prior to Visit  Medication Sig Dispense Refill   aspirin 81 MG tablet       No current facility-administered medications on file prior to visit.        ROS:  All others reviewed and negative.  Objective        PE:  BP 126/70 (BP Location: Left Arm, Patient Position: Sitting, Cuff Size: Large)   Pulse 76   Temp 98.3 F (36.8 C) (Oral)   Ht 5\' 11"  (1.803 m)   Wt 240 lb (108.9 kg)   SpO2 97%   BMI 33.47 kg/m                 Constitutional: Pt appears in NAD               HENT: Head: NCAT.                Right Ear: External ear normal.                 Left Ear: External ear normal.                Eyes: . Pupils are equal, round, and reactive to light. Conjunctivae and EOM are normal  Nose: without d/c or deformity               Neck: Neck supple. Gross normal ROM               Cardiovascular: Normal rate and regular rhythm.                 Pulmonary/Chest: Effort normal and breath sounds without rales or wheezing.                Abd:  Soft, NT, ND, + BS, no organomegaly               Neurological: Pt is alert. At baseline orientation, motor grossly intact               Skin: Skin is warm. No rashes, no other new lesions, LE edema - nonw               Psychiatric: Pt behavior is normal without agitation   Micro: none  Cardiac tracings I have personally interpreted today:  none  Pertinent Radiological findings (summarize): none   Lab Results  Component Value Date   WBC 5.3 09/27/2020   HGB 14.7 09/27/2020   HCT 44.0 09/27/2020   PLT 204.0 09/27/2020   GLUCOSE 110 (H) 09/27/2020   CHOL 152 09/27/2020   TRIG 140.0 09/27/2020   HDL 40.60 09/27/2020   LDLDIRECT 157.4 06/23/2007   LDLCALC 83 09/27/2020   ALT 20 09/27/2020   AST 18 09/27/2020   NA 140 09/27/2020   K 4.3 09/27/2020   CL 105 09/27/2020   CREATININE 0.89 09/27/2020   BUN 14 09/27/2020   CO2 27  09/27/2020   TSH 2.13 09/27/2020   PSA 2.00 09/27/2020   HGBA1C 6.6 (H) 09/27/2020   Assessment/Plan:  David Schneider is a 50 y.o. White or Caucasian [1] male with  has a past medical history of Allergic rhinitis, Allergy, ED (erectile dysfunction), and Hyperlipidemia.  Preventative health care Age and sex appropriate education and counseling updated with regular exercise and diet Referrals for preventative services - declines hep c screen  Immunizations addressed - declines covid booster, shingrix, tdap Smoking counseling  - none needed Evidence for depression or other mood disorder - none significant Most recent labs reviewed. I have personally reviewed and have noted: 1) the patient's medical and social history 2) The patient's current medications and supplements 3) The patient's height, weight, and BMI have been recorded in the chart   Blood pressure elevated without history of HTN BP Readings from Last 3 Encounters:  10/11/20 126/70  08/16/19 (!) 160/86  03/22/19 118/82   Stable, pt to continue med tx current   HLD (hyperlipidemia) Lab Results  Component Value Date   LDLCALC 83 09/27/2020   Mild uncontrolled,, pt to continue current statin pravachol 40, declines increased statin for now, for lower chol diet  Hyperglycemia Lab Results  Component Value Date   HGBA1C 6.6 (H) 09/27/2020   Stable, pt to start medical treatment  - metformin    ERECTILE DYSFUNCTION, NON-ORGANIC, MILD Ok for trial viagra prn  Followup: Return in about 6 months (around 04/13/2021).  Oliver Barre, MD 10/14/2020 8:11 PM [facility]

## 2020-10-11 NOTE — Patient Instructions (Signed)
Your shingle shot prescription was sent to the pharmacy  Please take all new medication as prescribed - the viagra instead of the cialis as needed  Please take all new medication as prescribed - the metformin ER 500 mg - 1 per day  Please continue all other medications as before, and refills have been done if requested - all other medications  Please have the pharmacy call with any other refills you may need.  Please continue your efforts at being more active, low cholesterol diet, and weight control.  You are otherwise up to date with prevention measures today.  Please keep your appointments with your specialists as you may have planned  Please make an Appointment to return in 6 months, or sooner if needed, also with Lab Appointment for testing done 3-5 days before at the FIRST FLOOR Lab (so this is for TWO appointments - please see the scheduling desk as you leave)  Due to the ongoing Covid 19 pandemic, our lab now requires an appointment for any labs done at our office.  If you need labs done and do not have an appointment, please call our office ahead of time to schedule before presenting to the lab for your testing.

## 2020-10-12 ENCOUNTER — Other Ambulatory Visit: Payer: Self-pay

## 2020-10-12 ENCOUNTER — Encounter: Payer: Self-pay | Admitting: Internal Medicine

## 2020-10-12 NOTE — Telephone Encounter (Signed)
   Patient is requesting a call back from PCP to discuss medications. He can be reached at [phone]

## 2020-10-12 NOTE — Telephone Encounter (Signed)
Left patient a voicemail to call office back.

## 2020-10-12 NOTE — Telephone Encounter (Signed)
Patient does not want to be labeled as diabetic, he states if you don't think its necessary for metformin with alc being 6.6.    Patient wants to check back 6 months.

## 2020-10-14 ENCOUNTER — Encounter: Payer: Self-pay | Admitting: Internal Medicine

## 2020-10-14 NOTE — Assessment & Plan Note (Signed)
Ok for trial viagra prn 

## 2020-10-14 NOTE — Assessment & Plan Note (Signed)
Lab Results  Component Value Date   HGBA1C 6.6 (H) 09/27/2020   Stable, pt to start medical treatment  - metformin

## 2020-10-14 NOTE — Assessment & Plan Note (Signed)
Age and sex appropriate education and counseling updated with regular exercise and diet Referrals for preventative services - declines hep c screen  Immunizations addressed - declines covid booster, shingrix, tdap Smoking counseling  - none needed Evidence for depression or other mood disorder - none significant Most recent labs reviewed. I have personally reviewed and have noted: 1) the patient's medical and social history 2) The patient's current medications and supplements 3) The patient's height, weight, and BMI have been recorded in the chart

## 2020-10-14 NOTE — Assessment & Plan Note (Signed)
BP Readings from Last 3 Encounters:  10/11/20 126/70  08/16/19 (!) 160/86  03/22/19 118/82   Stable, pt to continue med tx current

## 2020-10-14 NOTE — Assessment & Plan Note (Signed)
Lab Results  Component Value Date   LDLCALC 83 09/27/2020   Mild uncontrolled,, pt to continue current statin pravachol 40, declines increased statin for now, for lower chol diet

## 2021-02-11 ENCOUNTER — Other Ambulatory Visit: Payer: Self-pay

## 2021-02-11 ENCOUNTER — Encounter: Payer: Self-pay | Admitting: Family Medicine

## 2021-02-11 ENCOUNTER — Ambulatory Visit: Payer: Self-pay

## 2021-02-11 ENCOUNTER — Ambulatory Visit: Payer: BC Managed Care – PPO | Admitting: Family Medicine

## 2021-02-11 VITALS — BP 132/80 | HR 71 | Ht 71.0 in | Wt 247.6 lb

## 2021-02-11 DIAGNOSIS — M25512 Pain in left shoulder: Secondary | ICD-10-CM

## 2021-02-11 NOTE — Progress Notes (Signed)
   I, Philbert Riser, LAT, ATC acting as a scribe for Clementeen Graham, MD.  Subjective:    CC: L shoulder pain  HPI: Pt is a 50 y/o male c/o L shoulder pain. Pt was previously seen by Dr. Katrinka Blazing in 2019 for LBP. Today, pt c/o L shoulder pain x 2-3 weeks w/ no known MOI. Pt locates pain to his L ant and lateral shoulder.  Neck pain: yes in his L upper trap and lateral neck Radiates: intermittently into his L arm UE numbness/tingling: no Aggravates: laying on his L side; L shoulder aBd AROM; reaching Treatments tried: Aleve; Meloxicam  Pertinent review of Systems: No fevers or chills  Relevant historical information: Hyperlipidemia.   Objective:    Vitals:   02/11/21 1536  BP: 132/80  Pulse: 71  SpO2: 98%   General: Well Developed, well nourished, and in no acute distress.   MSK: Left shoulder normal-appearing Normal motion pain with abduction. Strength 4+/5 to abduction.  5/5 external and internal rotation. Positive Hawkins and Neer's test. Positive empty can test. Negative Yergason's and speeds test. Pulses capillary refill and sensation are intact distally.  Lab and Radiology Results  Diagnostic Limited MSK Ultrasound of: Left shoulder Biceps tendon intact normal. Subscapularis tendon normal. Supraspinatus tendon intact. Mild to moderate subacromial bursitis. Infraspinatus tendon normal-appearing AC joint with effusion. Impression: Subacromial bursitis and probable AC DJD.    Impression and Recommendations:    Assessment and Plan: 50 y.o. male with left shoulder pain ongoing for about 3 weeks thought to be due to impingement and subacromial bursitis.  Plan for physical therapy referral.  Recommend Tylenol and trying to use less meloxicam if possible.  Discussed safety of long-term meloxicam over time.  Recheck in about 6 weeks.  Return sooner if needed.  Next step if needed could be injection or MRI.  PDMP not reviewed this encounter. Orders Placed This  Encounter  Procedures   [place] LIMITED JOINT SPACE STRUCTURES UP LEFT(NO LINKED CHARGES)    Order Specific Question:   Reason for Exam (SYMPTOM  OR DIAGNOSIS REQUIRED)    Answer:   L shoulder pain    Order Specific Question:   Preferred imaging location?    Answer:   [facility] referral to Physical Therapy    Referral Priority:   Routine    Referral Type:   Physical Medicine    Referral Reason:   Specialty Services Required    Requested Specialty:   Physical Therapy    Number of Visits Requested:   1   No orders of the defined types were placed in this encounter.   Discussed warning signs or symptoms. Please see discharge instructions. Patient expresses understanding.   The above documentation has been reviewed and is accurate and complete Clementeen Graham, M.D.

## 2021-02-11 NOTE — Patient Instructions (Addendum)
Nice to meet you today.  I've referred you to Physical Therapy.  Please let me know if you don't hear from them in one week regarding scheduling.  Follow-up: 6 weeks

## 2021-02-18 DIAGNOSIS — M7542 Impingement syndrome of left shoulder: Secondary | ICD-10-CM | POA: Diagnosis not present

## 2021-02-18 DIAGNOSIS — M25512 Pain in left shoulder: Secondary | ICD-10-CM | POA: Diagnosis not present

## 2021-02-28 DIAGNOSIS — M25512 Pain in left shoulder: Secondary | ICD-10-CM | POA: Diagnosis not present

## 2021-02-28 DIAGNOSIS — M7542 Impingement syndrome of left shoulder: Secondary | ICD-10-CM | POA: Diagnosis not present

## 2021-03-26 ENCOUNTER — Ambulatory Visit: Payer: BC Managed Care – PPO | Admitting: Family Medicine

## 2021-04-04 DIAGNOSIS — M7542 Impingement syndrome of left shoulder: Secondary | ICD-10-CM | POA: Diagnosis not present

## 2021-04-04 DIAGNOSIS — M25512 Pain in left shoulder: Secondary | ICD-10-CM | POA: Diagnosis not present

## 2021-04-11 ENCOUNTER — Telehealth: Payer: Self-pay | Admitting: Internal Medicine

## 2021-04-11 NOTE — Telephone Encounter (Signed)
Patient spouse Jeronimo Norma calling in  Says they received bill that was turned over to collections agency for lab results patient received 08/2020 & are very  Says they feel they do not owe this bill as the labs were related to physical appt patient had 09/2020  Wants these charges looked into & corrected.Marland Kitchen also requesting cb from office supervisor  Please call [phone]

## 2021-04-11 NOTE — Telephone Encounter (Signed)
I am not sure what to say since the physical labs were done in late June for the July appointment

## 2021-05-20 DIAGNOSIS — H5213 Myopia, bilateral: Secondary | ICD-10-CM | POA: Diagnosis not present

## 2021-08-09 ENCOUNTER — Other Ambulatory Visit: Payer: Self-pay | Admitting: Internal Medicine

## 2021-10-08 ENCOUNTER — Other Ambulatory Visit: Payer: Self-pay | Admitting: Internal Medicine

## 2021-10-13 ENCOUNTER — Other Ambulatory Visit: Payer: Self-pay | Admitting: Internal Medicine

## 2021-10-13 DIAGNOSIS — E7849 Other hyperlipidemia: Secondary | ICD-10-CM

## 2021-12-09 ENCOUNTER — Other Ambulatory Visit: Payer: Self-pay | Admitting: Internal Medicine

## 2021-12-09 DIAGNOSIS — E7849 Other hyperlipidemia: Secondary | ICD-10-CM

## 2021-12-09 NOTE — Telephone Encounter (Signed)
Please refill as per office routine med refill policy (all routine meds to be refilled for 3 mo or monthly (per pt preference) up to one year from last visit, then month to month grace period for 3 mo, then further med refills will have to be denied) ? ?

## 2022-01-03 ENCOUNTER — Encounter: Payer: Self-pay | Admitting: Internal Medicine

## 2022-01-03 ENCOUNTER — Ambulatory Visit (INDEPENDENT_AMBULATORY_CARE_PROVIDER_SITE_OTHER): Payer: BC Managed Care – PPO | Admitting: Internal Medicine

## 2022-01-03 VITALS — BP 136/80 | HR 76 | Temp 98.2°F | Ht 71.0 in | Wt 247.0 lb

## 2022-01-03 DIAGNOSIS — Z125 Encounter for screening for malignant neoplasm of prostate: Secondary | ICD-10-CM | POA: Diagnosis not present

## 2022-01-03 DIAGNOSIS — Z0001 Encounter for general adult medical examination with abnormal findings: Secondary | ICD-10-CM | POA: Diagnosis not present

## 2022-01-03 DIAGNOSIS — E559 Vitamin D deficiency, unspecified: Secondary | ICD-10-CM | POA: Diagnosis not present

## 2022-01-03 DIAGNOSIS — E7849 Other hyperlipidemia: Secondary | ICD-10-CM

## 2022-01-03 DIAGNOSIS — R739 Hyperglycemia, unspecified: Secondary | ICD-10-CM | POA: Diagnosis not present

## 2022-01-03 DIAGNOSIS — R9431 Abnormal electrocardiogram [ECG] [EKG]: Secondary | ICD-10-CM

## 2022-01-03 DIAGNOSIS — E538 Deficiency of other specified B group vitamins: Secondary | ICD-10-CM | POA: Diagnosis not present

## 2022-01-03 LAB — CBC WITH DIFFERENTIAL/PLATELET
Basophils Absolute: 0 10*3/uL (ref 0.0–0.1)
Basophils Relative: 0.7 % (ref 0.0–3.0)
Eosinophils Absolute: 0.2 10*3/uL (ref 0.0–0.7)
Eosinophils Relative: 3.1 % (ref 0.0–5.0)
HCT: 43.7 % (ref 39.0–52.0)
Hemoglobin: 14.9 g/dL (ref 13.0–17.0)
Lymphocytes Relative: 22.7 % (ref 12.0–46.0)
Lymphs Abs: 1.3 10*3/uL (ref 0.7–4.0)
MCHC: 34.1 g/dL (ref 30.0–36.0)
MCV: 84.7 fl (ref 78.0–100.0)
Monocytes Absolute: 0.7 10*3/uL (ref 0.1–1.0)
Monocytes Relative: 12.2 % — ABNORMAL HIGH (ref 3.0–12.0)
Neutro Abs: 3.5 10*3/uL (ref 1.4–7.7)
Neutrophils Relative %: 61.3 % (ref 43.0–77.0)
Platelets: 202 10*3/uL (ref 150.0–400.0)
RBC: 5.16 Mil/uL (ref 4.22–5.81)
RDW: 14 % (ref 11.5–15.5)
WBC: 5.8 10*3/uL (ref 4.0–10.5)

## 2022-01-03 LAB — HEPATIC FUNCTION PANEL
ALT: 22 U/L (ref 0–53)
AST: 20 U/L (ref 0–37)
Albumin: 4.3 g/dL (ref 3.5–5.2)
Alkaline Phosphatase: 71 U/L (ref 39–117)
Bilirubin, Direct: 0.1 mg/dL (ref 0.0–0.3)
Total Bilirubin: 0.5 mg/dL (ref 0.2–1.2)
Total Protein: 7.1 g/dL (ref 6.0–8.3)

## 2022-01-03 LAB — TSH: TSH: 2.51 u[IU]/mL (ref 0.35–5.50)

## 2022-01-03 LAB — URINALYSIS, ROUTINE W REFLEX MICROSCOPIC
Bilirubin Urine: NEGATIVE
Hgb urine dipstick: NEGATIVE
Leukocytes,Ua: NEGATIVE
Nitrite: NEGATIVE
RBC / HPF: NONE SEEN (ref 0–?)
Specific Gravity, Urine: 1.025 (ref 1.000–1.030)
Total Protein, Urine: NEGATIVE
Urine Glucose: NEGATIVE
Urobilinogen, UA: 0.2 (ref 0.0–1.0)
pH: 5.5 (ref 5.0–8.0)

## 2022-01-03 LAB — HEMOGLOBIN A1C: Hgb A1c MFr Bld: 6.9 % — ABNORMAL HIGH (ref 4.6–6.5)

## 2022-01-03 LAB — BASIC METABOLIC PANEL
BUN: 15 mg/dL (ref 6–23)
CO2: 27 mEq/L (ref 19–32)
Calcium: 9.3 mg/dL (ref 8.4–10.5)
Chloride: 103 mEq/L (ref 96–112)
Creatinine, Ser: 0.91 mg/dL (ref 0.40–1.50)
GFR: 96.22 mL/min (ref >60.00)
Glucose, Bld: 111 mg/dL — ABNORMAL HIGH (ref 70–99)
Potassium: 4.3 mEq/L (ref 3.5–5.1)
Sodium: 139 mEq/L (ref 135–145)

## 2022-01-03 LAB — PSA: PSA: 2.03 ng/mL (ref 0.10–4.00)

## 2022-01-03 LAB — VITAMIN D 25 HYDROXY (VIT D DEFICIENCY, FRACTURES): VITD: 51.51 ng/mL (ref 30.00–100.00)

## 2022-01-03 LAB — LIPID PANEL
Cholesterol: 143 mg/dL (ref 0–200)
HDL: 40.5 mg/dL (ref >39.00)
LDL Cholesterol: 87 mg/dL (ref 0–99)
NonHDL: 102.7
Total CHOL/HDL Ratio: 4
Triglycerides: 80 mg/dL (ref 0.0–149.0)
VLDL: 16 mg/dL (ref 0.0–40.0)

## 2022-01-03 LAB — MICROALBUMIN / CREATININE URINE RATIO
Creatinine,U: 185.8 mg/dL
Microalb Creat Ratio: 0.4 mg/g (ref 0.0–30.0)
Microalb, Ur: 0.7 mg/dL (ref 0.0–1.9)

## 2022-01-03 LAB — VITAMIN B12: Vitamin B-12: 1254 pg/mL — ABNORMAL HIGH (ref 211–911)

## 2022-01-03 MED ORDER — VITAMIN B-12 1000 MCG SL SUBL: SUBLINGUAL_TABLET | SUBLINGUAL | 3 refills | Status: AC

## 2022-01-03 MED ORDER — PRAVASTATIN SODIUM 40 MG PO TABS: ORAL_TABLET | ORAL | 3 refills | Status: DC

## 2022-01-03 NOTE — Progress Notes (Signed)
Patient ID: David Schneider, male   DOB: [birth year], 51 y.o.   MRN: [id number]         Chief Complaint:: wellness exam       HPI:  David Schneider is a 51 y.o. male here for wellness exam; declines covid booster, shingrix, flu shot, tdap, hep c o/w up to date                        Also not taking the metformin. Pt denies chest pain, increased sob or doe, wheezing, orthopnea, PND, increased LE swelling, palpitations, dizziness or syncope.   Pt denies polydipsia, polyuria, or new focal neuro s/s.    Pt denies fever, wt loss, night sweats, loss of appetite, or other constitutional symptoms  Does seem to urinate large volumes with drinking ETOH o/w none.  Trying to follow lower chol diet.     Wt up about 7 lbs.   Wt Readings from Last 3 Encounters:  01/03/22 247 lb (112 kg)  02/11/21 247 lb 9.6 oz (112.3 kg)  10/11/20 240 lb (108.9 kg)   BP Readings from Last 3 Encounters:  01/03/22 136/80  02/11/21 132/80  10/11/20 126/70   Immunization History  Administered Date(s) Administered   PFIZER(Purple Top)SARS-COV-2 Vaccination 05/30/2019, 06/20/2019, 03/30/2020   Td 09/13/2009  There are no preventive care reminders to display for this patient.    Past Medical History:  Diagnosis Date   Allergic rhinitis    seasonal   Allergy    ED (erectile dysfunction)    Hyperlipidemia    Past Surgical History:  Procedure Laterality Date   TONSILLECTOMY     WISDOM TOOTH EXTRACTION      reports that he has never smoked. He has never used smokeless tobacco. He reports current alcohol use of about 6.0 standard drinks of alcohol per week. He reports that he does not use drugs. family history includes Diabetes in his brother, maternal grandmother, and mother; Esophageal cancer in his maternal grandfather; Heart attack in his maternal grandmother and mother; Hyperlipidemia in his father and mother; Hypertension in his father; Throat cancer in his maternal grandfather; Transient ischemic attack in  his mother. Allergies  Allergen Reactions   Fexofenadine-Pseudoephed Er     REACTION: headache   Current Outpatient Medications on File Prior to Visit  Medication Sig Dispense Refill   meloxicam (MOBIC) 15 MG tablet TAKE ONE TABLET BY MOUTH DAILY AS NEEDED FOR PAIN 30 tablet 0   sildenafil (VIAGRA) 100 MG tablet Take 0.5-1 tablets (50-100 mg total) by mouth daily as needed for erectile dysfunction. 5 tablet 11   tadalafil (CIALIS) 20 MG tablet TAKE 1 TABLET BY MOUTH EVERY 3 DAYS AS NEEDED 30 tablet 5   No current facility-administered medications on file prior to visit.        ROS:  All others reviewed and negative.  Objective        PE:  BP 136/80 (BP Location: Left Arm, Patient Position: Sitting, Cuff Size: Large)   Pulse 76   Temp 98.2 F (36.8 C) (Oral)   Ht 5\' 11"  (1.803 m)   Wt 247 lb (112 kg)   SpO2 95%   BMI 34.45 kg/m                 Constitutional: Pt appears in NAD               HENT: Head: NCAT.  Right Ear: External ear normal.                 Left Ear: External ear normal.                Eyes: . Pupils are equal, round, and reactive to light. Conjunctivae and EOM are normal               Nose: without d/c or deformity               Neck: Neck supple. Gross normal ROM               Cardiovascular: Normal rate and regular rhythm.                 Pulmonary/Chest: Effort normal and breath sounds without rales or wheezing.                Abd:  Soft, NT, ND, + BS, no organomegaly               Neurological: Pt is alert. At baseline orientation, motor grossly intact               Skin: Skin is warm. No rashes, no other new lesions, LE edema - none               Psychiatric: Pt behavior is normal without agitation   Micro: none  Cardiac tracings I have personally interpreted today:  none  Pertinent Radiological findings (summarize): none   Lab Results  Component Value Date   WBC 5.8 01/03/2022   HGB 14.9 01/03/2022   HCT 43.7 01/03/2022   PLT  202.0 01/03/2022   GLUCOSE 111 (H) 01/03/2022   CHOL 143 01/03/2022   TRIG 80.0 01/03/2022   HDL 40.50 01/03/2022   LDLDIRECT 157.4 06/23/2007   LDLCALC 87 01/03/2022   ALT 22 01/03/2022   AST 20 01/03/2022   NA 139 01/03/2022   K 4.3 01/03/2022   CL 103 01/03/2022   CREATININE 0.91 01/03/2022   BUN 15 01/03/2022   CO2 27 01/03/2022   TSH 2.51 01/03/2022   PSA 2.03 01/03/2022   HGBA1C 6.9 (H) 01/03/2022   MICROALBUR 0.7 01/03/2022   Assessment/Plan:  David Schneider is a 51 y.o. White or Caucasian [1] male with  has a past medical history of Allergic rhinitis, Allergy, ED (erectile dysfunction), and Hyperlipidemia.  Encounter for well adult exam with abnormal findings Age and sex appropriate education and counseling updated with regular exercise and diet Referrals for preventative services - declines hep c screen Immunizations addressed - declines shingrix, flu, tdap Smoking counseling  - none needed Evidence for depression or other mood disorder - none significant Most recent labs reviewed. I have personally reviewed and have noted: 1) the patient's medical and social history 2) The patient's current medications and supplements 3) The patient's height, weight, and BMI have been recorded in the chart   Hyperglycemia Lab Results  Component Value Date   HGBA1C 6.9 (H) 01/03/2022   Worsening, for diet, exercise, wt control, pt declines metformin for now   HLD (hyperlipidemia) Lab Results  Component Value Date   LDLCALC 87 01/03/2022   Uncontrolled, goal ldl < 70, pt to increase pravachol 40 mg qd, for cardiac CT score  Followup: No follow-ups on file.  Cathlean Cower, MD 01/04/2022 7:55 PM [facility]

## 2022-01-03 NOTE — Patient Instructions (Addendum)
Please have your Shingrix (shingles) shots done at your local pharmacy.  You will be contacted regarding the referral for: Cardiac CT score  Please continue all other medications as before, and refills have been done if requested.  Please have the pharmacy call with any other refills you may need.  Please continue your efforts at being more active, low cholesterol diet, and weight control.  You are otherwise up to date with prevention measures today.  Please keep your appointments with your specialists as you may have planned  Please go to the LAB at the blood drawing area for the tests to be done  You will be contacted by phone if any changes need to be made immediately.  Otherwise, you will receive a letter about your results with an explanation, but please check with MyChart first.  Please remember to sign up for MyChart if you have not done so, as this will be important to you in the future with finding out test results, communicating by private email, and scheduling acute appointments online when needed.  Please make an Appointment to return for your 1 year visit, or sooner if needed

## 2022-01-04 NOTE — Assessment & Plan Note (Signed)
Age and sex appropriate education and counseling updated with regular exercise and diet Referrals for preventative services - declines hep c screen Immunizations addressed - declines shingrix, flu, tdap Smoking counseling  - none needed Evidence for depression or other mood disorder - none significant Most recent labs reviewed. I have personally reviewed and have noted: 1) the patient's medical and social history 2) The patient's current medications and supplements 3) The patient's height, weight, and BMI have been recorded in the chart

## 2022-01-04 NOTE — Assessment & Plan Note (Signed)
Lab Results  Component Value Date   HGBA1C 6.9 (H) 01/03/2022   Worsening, for diet, exercise, wt control, pt declines metformin for now

## 2022-01-04 NOTE — Assessment & Plan Note (Addendum)
Lab Results  Component Value Date   LDLCALC 87 01/03/2022   Uncontrolled, goal ldl < 70, pt to increase pravachol 40 mg qd, for cardiac CT score

## 2022-01-10 ENCOUNTER — Ambulatory Visit
Admission: RE | Admit: 2022-01-10 | Discharge: 2022-01-10 | Disposition: A | Discharge status: Home / Self Care | Payer: No Typology Code available for payment source | Source: Ambulatory Visit | Attending: Internal Medicine | Admitting: Internal Medicine

## 2022-01-10 DIAGNOSIS — I2510 Atherosclerotic heart disease of native coronary artery without angina pectoris: Secondary | ICD-10-CM | POA: Diagnosis not present

## 2022-01-10 DIAGNOSIS — R739 Hyperglycemia, unspecified: Secondary | ICD-10-CM

## 2022-01-10 DIAGNOSIS — E7849 Other hyperlipidemia: Secondary | ICD-10-CM

## 2022-01-10 DIAGNOSIS — R9431 Abnormal electrocardiogram [ECG] [EKG]: Secondary | ICD-10-CM

## 2022-05-30 ENCOUNTER — Encounter: Payer: Self-pay | Admitting: Internal Medicine

## 2022-06-06 ENCOUNTER — Ambulatory Visit: Payer: BC Managed Care – PPO | Admitting: Internal Medicine

## 2022-06-06 VITALS — BP 130/78 | HR 88 | Temp 99.3°F | Ht 71.0 in | Wt 249.0 lb

## 2022-06-06 DIAGNOSIS — R739 Hyperglycemia, unspecified: Secondary | ICD-10-CM | POA: Diagnosis not present

## 2022-06-06 DIAGNOSIS — E669 Obesity, unspecified: Secondary | ICD-10-CM

## 2022-06-06 DIAGNOSIS — E559 Vitamin D deficiency, unspecified: Secondary | ICD-10-CM

## 2022-06-06 DIAGNOSIS — R030 Elevated blood-pressure reading, without diagnosis of hypertension: Secondary | ICD-10-CM

## 2022-06-06 DIAGNOSIS — E785 Hyperlipidemia, unspecified: Secondary | ICD-10-CM | POA: Diagnosis not present

## 2022-06-06 DIAGNOSIS — E538 Deficiency of other specified B group vitamins: Secondary | ICD-10-CM

## 2022-06-06 DIAGNOSIS — Z125 Encounter for screening for malignant neoplasm of prostate: Secondary | ICD-10-CM

## 2022-06-06 DIAGNOSIS — R5383 Other fatigue: Secondary | ICD-10-CM

## 2022-06-06 MED ORDER — TOPIRAMATE 50 MG PO TABS: 50.0000 mg | ORAL_TABLET | Freq: Two times a day (BID) | ORAL | 1 refills | Status: DC

## 2022-06-06 MED ORDER — PHENTERMINE HCL 30 MG PO CAPS: 30.0000 mg | ORAL_CAPSULE | ORAL | 1 refills | Status: DC

## 2022-06-06 NOTE — Patient Instructions (Signed)
Your A1c was done today  Please take all new medication as prescribed - the phentermine and topiramate  Please continue all other medications as before, and refills have been done if requested.  Please have the pharmacy call with any other refills you may need.  Please continue your efforts at being more active, low cholesterol diet, and weight control.  You are otherwise up to date with prevention measures today.  Please keep your appointments with your specialists as you may have planned  Please make an Appointment to return in Jan 09, 2023  or sooner if needed, also with Lab Appointment for testing done 3-5 days before at [facility]

## 2022-06-06 NOTE — Progress Notes (Unsigned)
Patient ID: David Schneider, male   DOB: Jul 31, 1970, 52 y.o.   MRN: [id number]        Chief Complaint: follow up HTN, HLD and hyperglycemia ***       HPI:  David Schneider is a 52 y.o. male here with c/o        Wt Readings from Last 3 Encounters:  06/06/22 249 lb (112.9 kg)  01/03/22 247 lb (112 kg)  02/11/21 247 lb 9.6 oz (112.3 kg)   BP Readings from Last 3 Encounters:  06/06/22 130/78  01/03/22 136/80  02/11/21 132/80         Past Medical History:  Diagnosis Date   Allergic rhinitis    seasonal   Allergy    ED (erectile dysfunction)    Hyperlipidemia    Past Surgical History:  Procedure Laterality Date   TONSILLECTOMY     WISDOM TOOTH EXTRACTION      reports that he has never smoked. He has never used smokeless tobacco. He reports current alcohol use of about 6.0 standard drinks of alcohol per week. He reports that he does not use drugs. family history includes Diabetes in his brother, maternal grandmother, and mother; Esophageal cancer in his maternal grandfather; Heart attack in his maternal grandmother and mother; Hyperlipidemia in his father and mother; Hypertension in his father; Throat cancer in his maternal grandfather; Transient ischemic attack in his mother. Allergies  Allergen Reactions   Fexofenadine-Pseudoephed Er     REACTION: headache   Current Outpatient Medications on File Prior to Visit  Medication Sig Dispense Refill   Cyanocobalamin (VITAMIN B-12) 1000 MCG SUBL PLACE ONE TABLET UNDER THE TONGUE DAILY 100 tablet 3   meloxicam (MOBIC) 15 MG tablet TAKE ONE TABLET BY MOUTH DAILY AS NEEDED FOR PAIN 30 tablet 0   pravastatin (PRAVACHOL) 40 MG tablet TAKE 1 TABLET BY MOUTH AT BEDTIMe 90 tablet 3   sildenafil (VIAGRA) 100 MG tablet Take 0.5-1 tablets (50-100 mg total) by mouth daily as needed for erectile dysfunction. 5 tablet 11   tadalafil (CIALIS) 20 MG tablet TAKE 1 TABLET BY MOUTH EVERY 3 DAYS AS NEEDED 30 tablet 5   No current  facility-administered medications on file prior to visit.        ROS:  All others reviewed and negative.  Objective        PE:  BP 130/78   Pulse 88   Temp 99.3 F (37.4 C) (Oral)   Ht '5\' 11"'$  (1.803 m)   Wt 249 lb (112.9 kg)   SpO2 95%   BMI 34.73 kg/m                 Constitutional: Pt appears in NAD               HENT: Head: NCAT.                Right Ear: External ear normal.                 Left Ear: External ear normal.                Eyes: . Pupils are equal, round, and reactive to light. Conjunctivae and EOM are normal               Nose: without d/c or deformity               Neck: Neck supple. Gross normal ROM  Cardiovascular: Normal rate and regular rhythm.                 Pulmonary/Chest: Effort normal and breath sounds without rales or wheezing.                Abd:  Soft, NT, ND, + BS, no organomegaly               Neurological: Pt is alert. At baseline orientation, motor grossly intact               Skin: Skin is warm. No rashes, no other new lesions, LE edema - ***               Psychiatric: Pt behavior is normal without agitation   Micro: none  Cardiac tracings I have personally interpreted today:  none  Pertinent Radiological findings (summarize): none   Lab Results  Component Value Date   WBC 5.8 01/03/2022   HGB 14.9 01/03/2022   HCT 43.7 01/03/2022   PLT 202.0 01/03/2022   GLUCOSE 111 (H) 01/03/2022   CHOL 143 01/03/2022   TRIG 80.0 01/03/2022   HDL 40.50 01/03/2022   LDLDIRECT 157.4 06/23/2007   LDLCALC 87 01/03/2022   ALT 22 01/03/2022   AST 20 01/03/2022   NA 139 01/03/2022   K 4.3 01/03/2022   CL 103 01/03/2022   CREATININE 0.91 01/03/2022   BUN 15 01/03/2022   CO2 27 01/03/2022   TSH 2.51 01/03/2022   PSA 2.03 01/03/2022   HGBA1C 6.9 (H) 01/03/2022   MICROALBUR 0.7 01/03/2022   Assessment/Plan:  DELORIAN SABIA is a 52 y.o. White or Caucasian [1] male with  has a past medical history of Allergic rhinitis, Allergy, ED  (erectile dysfunction), and Hyperlipidemia.  No problem-specific Assessment & Plan notes found for this encounter.  Followup: No follow-ups on file.  Cathlean Cower, MD 06/06/2022 3:23 PM [facility]

## 2022-06-07 ENCOUNTER — Encounter: Payer: Self-pay | Admitting: Internal Medicine

## 2022-06-07 NOTE — Assessment & Plan Note (Signed)
Lab Results  Component Value Date   HGBA1C 6.9 (H) 01/03/2022   Now recently new diabetic  pt declines OHA for now, plans to double down on diet, excercise

## 2022-06-07 NOTE — Assessment & Plan Note (Addendum)
D/w pt, declines GLP1 inhibitor tx for now, plans to work on lifestyle changes, to start phentermine/topamax asd,  to f/u any worsening symptoms or concerns

## 2022-06-07 NOTE — Assessment & Plan Note (Signed)
Borderline uncontrolled today, declines med tx start, to work on diet, exercise, wt control

## 2022-06-07 NOTE — Assessment & Plan Note (Signed)
Lab Results  Component Value Date   [place] 87 01/03/2022   Uncontrolled, pt states will work on lower chol DM diet,  pt to continue current statin pravachol 40 mg qd, for change if not improved next visit such as crestor

## 2022-06-09 LAB — POCT GLYCOSYLATED HEMOGLOBIN (HGB A1C): Hemoglobin A1C: 5.4 % (ref 4.0–5.6)

## 2022-07-24 ENCOUNTER — Encounter: Payer: Self-pay | Admitting: Internal Medicine

## 2022-07-25 ENCOUNTER — Ambulatory Visit: Payer: BC Managed Care – PPO | Admitting: Nurse Practitioner

## 2022-07-25 ENCOUNTER — Encounter: Payer: Self-pay | Admitting: Nurse Practitioner

## 2022-07-25 VITALS — BP 122/80 | HR 88 | Temp 97.8°F | Ht 71.0 in | Wt 226.0 lb

## 2022-07-25 DIAGNOSIS — R350 Frequency of micturition: Secondary | ICD-10-CM | POA: Diagnosis not present

## 2022-07-25 LAB — POC URINALSYSI DIPSTICK (AUTOMATED)
Bilirubin, UA: NEGATIVE
Blood, UA: NEGATIVE
Glucose, UA: NEGATIVE
Ketones, UA: NEGATIVE
Leukocytes, UA: NEGATIVE
Nitrite, UA: NEGATIVE
Protein, UA: NEGATIVE
Spec Grav, UA: >=1.03 — AB (ref 1.010–1.025)
Urobilinogen, UA: 0.2 E.U./dL
pH, UA: 5.5 (ref 5.0–8.0)

## 2022-07-25 MED ORDER — LEVOFLOXACIN 500 MG PO TABS: 500.0000 mg | ORAL_TABLET | Freq: Every day | ORAL | 0 refills | Status: AC

## 2022-07-25 NOTE — Assessment & Plan Note (Signed)
Symptoms concerning for acute bacterial prostatitis today.  He was unable to give a urine sample in the office today, we will have him bring back a urine sample when able.  Will check UA and urine culture.  Will have him start Levaquin 500 mg daily for 4 weeks.  Encouraged him to drink plenty of fluids.  Will also have him start of a probiotic daily while taking the antibiotics.  Discussed not doing heavy exercise while taking the antibiotics.  Follow-up if symptoms worsen or with any concerns.

## 2022-07-25 NOTE — Patient Instructions (Signed)
It was great to see you!  Start levaquin once a day for 28 days.   Start a probiotic over the counter daily with it (floraster, culturelle, etc)  Keep drinking plenty of fluids.   Let's follow-up if your symptoms worsen or don't improve.   Take care,  Rodman Pickle, NP

## 2022-07-25 NOTE — Progress Notes (Signed)
Acute Office Visit  Subjective:     Patient ID: David Schneider, male    DOB: [birth year], 52 y.o.   MRN: [id number]  Chief Complaint  Patient presents with   Prostate Infection    HPI Patient is in today for difficulty starting a urine stream, urinary frequency and dysuria.  He states that he also noticed a decrease in his libido.  He states that this started over the last few weeks.  He noticed that during intercourse it was painful when he ejaculated.  He has also had a fullness sensation in his rectum a few times.  He denies fevers, abdominal pain.  He did start phentermine and Topamax recently for weight loss and is not sure if it is related to this medication.  He denies concerns for STDs.  He has not had symptoms like this before.  ROS See pertinent positives and negatives per HPI.     Objective:    BP 122/80 (BP Location: Left Arm)   Pulse 88   Temp 97.8 F (36.6 C)   Ht 5\' 11"  (1.803 m)   Wt 226 lb (102.5 kg)   SpO2 98%   BMI 31.52 kg/m    Physical Exam Vitals and nursing note reviewed.  Constitutional:      Appearance: Normal appearance.  HENT:     Head: Normocephalic.  Eyes:     Conjunctiva/sclera: Conjunctivae normal.  Cardiovascular:     Rate and Rhythm: Normal rate and regular rhythm.     Pulses: Normal pulses.     Heart sounds: Normal heart sounds.  Pulmonary:     Effort: Pulmonary effort is normal.     Breath sounds: Normal breath sounds.  Abdominal:     Palpations: Abdomen is soft.     Tenderness: There is no abdominal tenderness. There is no right CVA tenderness or left CVA tenderness.  Musculoskeletal:     Cervical back: Normal range of motion.  Skin:    General: Skin is warm.  Neurological:     General: No focal deficit present.     Mental Status: He is alert and oriented to person, place, and time.  Psychiatric:        Mood and Affect: Mood normal.        Behavior: Behavior normal.        Thought Content: Thought content normal.         Judgment: Judgment normal.     Results for orders placed or performed in visit on 07/25/22  POCT Urinalysis Dipstick (Automated)  Result Value Ref Range   Color, UA     Clarity, UA     Glucose, UA Negative Negative   Bilirubin, UA neg    Ketones, UA neg    Spec Grav, UA >=1.030 (A) 1.010 - 1.025   Blood, UA neg    pH, UA 5.5 5.0 - 8.0   Protein, UA Negative Negative   Urobilinogen, UA 0.2 0.2 or 1.0 E.U./dL   Nitrite, UA neg    Leukocytes, UA Negative Negative        Assessment & Plan:   Problem List Items Addressed This Visit       Other   Frequent urination - Primary    Symptoms concerning for acute bacterial prostatitis today.  He was unable to give a urine sample in the office today, we will have him bring back a urine sample when able.  Will check UA and urine culture.  Will have him start Levaquin  500 mg daily for 4 weeks.  Encouraged him to drink plenty of fluids.  Will also have him start of a probiotic daily while taking the antibiotics.  Discussed not doing heavy exercise while taking the antibiotics.  Follow-up if symptoms worsen or with any concerns.      Relevant Orders   POCT Urinalysis Dipstick (Automated) (Completed)   Urine Culture    Meds ordered this encounter  Medications   levofloxacin (LEVAQUIN) 500 MG tablet    Sig: Take 1 tablet (500 mg total) by mouth daily for 28 days.    Dispense:  28 tablet    Refill:  0    Return if symptoms worsen or fail to improve.  Gerre Scull, NP

## 2022-07-26 LAB — URINE CULTURE
MICRO NUMBER:: 14879792
Result:: NO GROWTH
SPECIMEN QUALITY:: ADEQUATE

## 2022-08-04 ENCOUNTER — Encounter: Payer: Self-pay | Admitting: Nurse Practitioner

## 2022-08-04 DIAGNOSIS — R350 Frequency of micturition: Secondary | ICD-10-CM

## 2022-08-07 NOTE — Addendum Note (Signed)
Addended by: Rodman Pickle A on: 08/07/2022 12:06 PM   Modules accepted: Orders

## 2022-08-08 ENCOUNTER — Other Ambulatory Visit (INDEPENDENT_AMBULATORY_CARE_PROVIDER_SITE_OTHER): Payer: BC Managed Care – PPO

## 2022-08-08 DIAGNOSIS — R350 Frequency of micturition: Secondary | ICD-10-CM

## 2022-08-08 LAB — PSA: PSA: 3.95 ng/mL (ref 0.10–4.00)

## 2022-08-08 MED ORDER — TAMSULOSIN HCL 0.4 MG PO CAPS: 0.4000 mg | ORAL_CAPSULE | Freq: Every day | ORAL | 0 refills | Status: DC

## 2022-08-08 NOTE — Progress Notes (Signed)
1 gold drawn. Dm/cma  

## 2022-08-08 NOTE — Addendum Note (Signed)
Addended by: Rodman Pickle A on: 08/08/2022 10:15 PM   Modules accepted: Orders

## 2022-08-14 ENCOUNTER — Encounter: Payer: Self-pay | Admitting: Internal Medicine

## 2022-08-14 MED ORDER — TRIAMCINOLONE ACETONIDE 0.1 % EX CREA: 1.0000 | TOPICAL_CREAM | Freq: Two times a day (BID) | CUTANEOUS | 0 refills | Status: AC

## 2022-08-21 ENCOUNTER — Ambulatory Visit: Payer: BC Managed Care – PPO | Admitting: Family Medicine

## 2022-08-21 ENCOUNTER — Encounter: Payer: Self-pay | Admitting: Family Medicine

## 2022-08-21 VITALS — BP 134/82 | HR 93 | Temp 98.0°F | Ht 71.0 in | Wt 224.2 lb

## 2022-08-21 DIAGNOSIS — L237 Allergic contact dermatitis due to plants, except food: Secondary | ICD-10-CM | POA: Diagnosis not present

## 2022-08-21 MED ORDER — PREDNISONE 20 MG PO TABS: 40.0000 mg | ORAL_TABLET | Freq: Every day | ORAL | 0 refills | Status: DC

## 2022-08-21 MED ORDER — METHYLPREDNISOLONE ACETATE 80 MG/ML IJ SUSP
80.0000 mg | Freq: Once | INTRAMUSCULAR | Status: AC
  Administered 2022-08-21: 80 mg via INTRAMUSCULAR

## 2022-08-21 NOTE — Progress Notes (Signed)
Subjective:     Patient ID: David Schneider, male    DOB: 1970-04-27, 52 y.o.   MRN: [id number]  Chief Complaint  Patient presents with   [facility]    Patient states that he has poison ivy abd area, down to his groin and legs x 8 days     HPI Patient is in today for a pruritic rash x 8 days. The rash is spreading and is on his chest, abdomen and bilateral upper legs. Reports he was working outside, mowing and in the weeds. No fever, chills, headache, cough, shortness of breath, abdominal pain, N/V.   He has been using topical triamcinolone without relief.   Health Maintenance Due  Topic Date Due   Zoster Vaccines- Shingrix (1 of 2) Never done   DTaP/Tdap/Td (2 - Tdap) 09/14/2019   COVID-19 Vaccine (4 - 2023-24 season) 11/29/2021    Past Medical History:  Diagnosis Date   Allergic rhinitis    seasonal   Allergy    ED (erectile dysfunction)    Hyperlipidemia     Past Surgical History:  Procedure Laterality Date   TONSILLECTOMY     WISDOM TOOTH EXTRACTION      Family History  Problem Relation Age of Onset   Hypertension Father    Hyperlipidemia Father    Transient ischemic attack Mother    Diabetes Mother    Hyperlipidemia Mother    Heart attack Mother        2006; stents earely 60s   Diabetes Brother    Diabetes Maternal Grandmother    Heart attack Maternal Grandmother        70s   Throat cancer Maternal Grandfather        history of ETOH & smoking   Esophageal cancer Maternal Grandfather    Colon cancer Neg Hx    Stomach cancer Neg Hx    Rectal cancer Neg Hx     Social History   Socioeconomic History   Marital status: Married    Spouse name: Not on file   Number of children: Not on file   Years of education: Not on file   Highest education level: Not on file  Occupational History   Occupation: Engineer, maintenance: [employer]  Tobacco Use   Smoking status: Never   Smokeless tobacco: Never  Vaping Use   Vaping Use: Never used   Substance and Sexual Activity   Alcohol use: Yes    Alcohol/week: 6.0 standard drinks of alcohol    Types: 6 Cans of beer per week    Comment: socially only   Drug use: No   Sexual activity: Not on file  Other Topics Concern   Not on file  Social History Narrative   Not on file   Social Determinants of Health   Financial Resource Strain: Not on file  Food Insecurity: Not on file  Transportation Needs: Not on file  Physical Activity: Not on file  Stress: Not on file  Social Connections: Not on file  Intimate Partner Violence: Not on file    Outpatient Medications Prior to Visit  Medication Sig Dispense Refill   Cyanocobalamin (VITAMIN B-12) 1000 MCG SUBL PLACE ONE TABLET UNDER THE TONGUE DAILY 100 tablet 3   levofloxacin (LEVAQUIN) 500 MG tablet Take 1 tablet (500 mg total) by mouth daily for 28 days. 28 tablet 0   phentermine 30 MG capsule Take 1 capsule (30 mg total) by mouth every morning. 90 capsule 1  pravastatin (PRAVACHOL) 40 MG tablet TAKE 1 TABLET BY MOUTH AT BEDTIMe 90 tablet 3   tamsulosin (FLOMAX) 0.4 MG CAPS capsule Take 1 capsule (0.4 mg total) by mouth daily. 30 capsule 0   triamcinolone cream (KENALOG) 0.1 % Apply 1 Application topically 2 (two) times daily. 30 g 0   meloxicam (MOBIC) 15 MG tablet TAKE ONE TABLET BY MOUTH DAILY AS NEEDED FOR PAIN (Patient not taking: Reported on 07/25/2022) 30 tablet 0   sildenafil (VIAGRA) 100 MG tablet Take 0.5-1 tablets (50-100 mg total) by mouth daily as needed for erectile dysfunction. (Patient not taking: Reported on 07/25/2022) 5 tablet 11   tadalafil (CIALIS) 20 MG tablet TAKE 1 TABLET BY MOUTH EVERY 3 DAYS AS NEEDED (Patient not taking: Reported on 07/25/2022) 30 tablet 5   topiramate (TOPAMAX) 50 MG tablet Take 1 tablet (50 mg total) by mouth 2 (two) times daily. (Patient not taking: Reported on 08/21/2022) 180 tablet 1   No facility-administered medications prior to visit.    Allergies  Allergen Reactions    Fexofenadine-Pseudoephed Er     REACTION: headache    ROS     Objective:    Physical Exam  BP 134/82   Pulse 93   Temp 98 F (36.7 C) (Oral)   Ht 5\' 11"  (1.803 m)   Wt 224 lb 4 oz (101.7 kg)   SpO2 97%   BMI 31.28 kg/m  Wt Readings from Last 3 Encounters:  08/21/22 224 lb 4 oz (101.7 kg)  07/25/22 226 lb (102.5 kg)  06/06/22 249 lb (112.9 kg)   Multiple raised red papules, some in linear fashion, others in clusters on chest, abdomen, bilateral groin L>R.  Scabbing on some areas. No sign of secondary bacterial infection.     Assessment & Plan:   Problem List Items Addressed This Visit   None Visit Diagnoses     Contact dermatitis due to poison ivy    -  Primary   Relevant Medications   predniSONE (DELTASONE) 20 MG tablet   methylPREDNISolone acetate (DEPO-MEDROL) injection 80 mg (Completed)      Depo-Medrol 80 IM given. He will start oral steroids in 2 days. Discussed managing itching. Advised him to avoid getting sun and being in the heat to help control itching.  Follow up if any signs of infection or worsening condition.   I am having Horton Chin "Joe" start on predniSONE. I am also having him maintain his sildenafil, tadalafil, meloxicam, pravastatin, Vitamin B-12, topiramate, phentermine, levofloxacin, tamsulosin, and triamcinolone cream. We administered methylPREDNISolone acetate.  Meds ordered this encounter  Medications   predniSONE (DELTASONE) 20 MG tablet    Sig: Take 2 tablets (40 mg total) by mouth daily with breakfast.    Dispense:  10 tablet    Refill:  0    Order Specific Question:   Supervising Provider    Answer:   Hillard Danker A [[id number]]   methylPREDNISolone acetate (DEPO-MEDROL) injection 80 mg

## 2022-08-21 NOTE — Patient Instructions (Signed)
Take the oral steroids with breakfast. Drink plenty of water.   Heat and sun may increase the itching and discomfort.    Poison Ivy Dermatitis Poison ivy dermatitis is irritation and swelling (inflammation) of the skin caused by chemicals in the leaves of the poison ivy plant. The skin reaction often involves redness, blisters, and extreme itching. What are the causes? This condition is caused by a chemical (urushiol) found in the sap of the poison ivy plant. This chemical is sticky and can easily spread to people, animals, and objects. You can get poison ivy dermatitis by: Having direct contact with a poison ivy plant. Touching animals, other people, or objects that have come in contact with poison ivy and have the chemical on them. What increases the risk? This condition is more likely to develop in people who: Are outdoors often in wooded or [place] areas. Go outdoors without wearing protective clothing, such as closed shoes, long pants, and a long-sleeved shirt. What are the signs or symptoms? Symptoms of this condition include: Redness of the skin. Extreme itching. A rash that often includes bumps and blisters. The rash usually appears 48 hours after exposure, if you have been exposed before. If this is the first time you have been exposed, the rash may not appear until a week after exposure. Swelling. This may occur if the reaction is more severe. Symptoms usually last for 1-2 weeks. However, the first time you develop this condition, symptoms may last 3-4 weeks. How is this diagnosed? This condition may be diagnosed based on your symptoms and a physical exam. Your health care provider may also ask you about any recent outdoor activity. How is this treated? Treatment for this condition will vary depending on how severe it is. Treatment may include: Hydrocortisone cream or calamine lotion to relieve itching. Oatmeal baths to soothe the skin. Medicines, such as over-the-counter  antihistamine tablets. Oral or injected steroid medicine, for more severe reactions. Follow these instructions at home: Medicines Take or apply over-the-counter and prescription medicines only as told by your health care provider. Use hydrocortisone cream or calamine lotion as needed to soothe the skin and relieve itching. General instructions Do not scratch or rub your skin. Apply a cold, wet cloth (cold compress) to the affected areas or take baths in cool water. This will help with itching. Avoid hot baths and showers. Take oatmeal baths as needed. Use colloidal oatmeal. You can get this at your local pharmacy or grocery store. Follow the instructions on the packaging. Wash all clothes, bedsheets, towels, and blankets you were in contact with between your exposure and appearance of the rash. Check the affected area every day for signs of infection. Check for: More redness, swelling, or pain. Fluid or blood. Warmth. Pus or a bad smell. Keep all follow-up visits. Your health care provider may want to see how your skin is progressing with treatment. How is this prevented?  Learn to identify the poison ivy plant and avoid contact with the plant. This plant can be recognized by the number of leaves. Generally, poison ivy has three leaves with flowering branches on a single stem. The leaves are typically glossy, and they have jagged edges that come to a point. If you have been exposed to poison ivy, thoroughly wash with soap and water right away. You have about 30 minutes to remove the plant resin before it will cause the rash. Be sure to wash under your fingernails, because any plant resin there will continue to spread the  rash. When hiking or camping, wear clothes that will help you to avoid skin exposure. This includes long pants, a long-sleeved shirt, long socks, and hiking boots. You can also apply preventive lotion to your skin to help limit exposure. If you suspect that your clothes or  outdoor gear came in contact with poison ivy, rinse them off outside with a garden hose before you bring them inside your house. When doing yard work or gardening, wear gloves, long sleeves, long pants, and boots. Wash your garden tools and gloves if they come in contact with poison ivy. If you suspect that your pet has come into contact with poison ivy, wash them with pet shampoo and water. Make sure to wear gloves while washing your pet. Contact a health care provider if: You have open sores in the rash area. You have any signs of infection. You have redness that spreads beyond the rash area. You have a fever. You have a rash over a large area of your body. You have a rash on your eyes, mouth, or genitals. You have a rash that does not improve after a few weeks. Get help right away if: Your face swells or your eyes swell shut. You have trouble breathing. You have trouble swallowing. These symptoms may be an emergency. Get help right away. Call 911. Do not wait to see if the symptoms will go away. Do not drive yourself to the hospital. This information is not intended to replace advice given to you by your health care provider. Make sure you discuss any questions you have with your health care provider. Document Revised: 08/15/2021 Document Reviewed: 08/15/2021 Elsevier Patient Education  2024 [employer].

## 2022-08-22 ENCOUNTER — Encounter: Payer: Self-pay | Admitting: Internal Medicine

## 2022-08-22 ENCOUNTER — Other Ambulatory Visit: Payer: Self-pay | Admitting: Internal Medicine

## 2022-08-22 NOTE — Telephone Encounter (Signed)
Pt reported not taking viagra or cialis at last visit

## 2022-08-22 NOTE — Telephone Encounter (Signed)
Ok yes, please for pt to make ROV in 1-2 wks for f/u lab and exam

## 2022-08-27 ENCOUNTER — Other Ambulatory Visit: Payer: BC Managed Care – PPO

## 2022-09-01 ENCOUNTER — Other Ambulatory Visit: Payer: Self-pay | Admitting: Internal Medicine

## 2022-09-03 ENCOUNTER — Ambulatory Visit: Payer: BC Managed Care – PPO | Admitting: Internal Medicine

## 2022-09-04 ENCOUNTER — Other Ambulatory Visit: Payer: Self-pay | Admitting: Nurse Practitioner

## 2022-09-10 ENCOUNTER — Other Ambulatory Visit (INDEPENDENT_AMBULATORY_CARE_PROVIDER_SITE_OTHER): Payer: BC Managed Care – PPO

## 2022-09-10 DIAGNOSIS — Z125 Encounter for screening for malignant neoplasm of prostate: Secondary | ICD-10-CM | POA: Diagnosis not present

## 2022-09-10 DIAGNOSIS — E538 Deficiency of other specified B group vitamins: Secondary | ICD-10-CM

## 2022-09-10 DIAGNOSIS — E559 Vitamin D deficiency, unspecified: Secondary | ICD-10-CM

## 2022-09-10 DIAGNOSIS — R739 Hyperglycemia, unspecified: Secondary | ICD-10-CM

## 2022-09-10 DIAGNOSIS — E785 Hyperlipidemia, unspecified: Secondary | ICD-10-CM

## 2022-09-10 DIAGNOSIS — R5383 Other fatigue: Secondary | ICD-10-CM | POA: Diagnosis not present

## 2022-09-10 LAB — BASIC METABOLIC PANEL
BUN: 12 mg/dL (ref 6–23)
CO2: 29 mEq/L (ref 19–32)
Calcium: 9.1 mg/dL (ref 8.4–10.5)
Chloride: 105 mEq/L (ref 96–112)
Creatinine, Ser: 0.88 mg/dL (ref 0.40–1.50)
GFR: 97.7 mL/min (ref >60.00)
Glucose, Bld: 107 mg/dL — ABNORMAL HIGH (ref 70–99)
Potassium: 4.8 mEq/L (ref 3.5–5.1)
Sodium: 141 mEq/L (ref 135–145)

## 2022-09-10 LAB — CBC WITH DIFFERENTIAL/PLATELET
Basophils Absolute: 0 10*3/uL (ref 0.0–0.1)
Basophils Relative: 0.3 % (ref 0.0–3.0)
Eosinophils Absolute: 0.1 10*3/uL (ref 0.0–0.7)
Eosinophils Relative: 1.9 % (ref 0.0–5.0)
HCT: 43.3 % (ref 39.0–52.0)
Hemoglobin: 14.5 g/dL (ref 13.0–17.0)
Lymphocytes Relative: 18.9 % (ref 12.0–46.0)
Lymphs Abs: 1.3 10*3/uL (ref 0.7–4.0)
MCHC: 33.4 g/dL (ref 30.0–36.0)
MCV: 85.6 fl (ref 78.0–100.0)
Monocytes Absolute: 0.5 10*3/uL (ref 0.1–1.0)
Monocytes Relative: 7.7 % (ref 3.0–12.0)
Neutro Abs: 5 10*3/uL (ref 1.4–7.7)
Neutrophils Relative %: 71.2 % (ref 43.0–77.0)
Platelets: 185 10*3/uL (ref 150.0–400.0)
RBC: 5.06 Mil/uL (ref 4.22–5.81)
RDW: 14.8 % (ref 11.5–15.5)
WBC: 7 10*3/uL (ref 4.0–10.5)

## 2022-09-10 LAB — URINALYSIS, ROUTINE W REFLEX MICROSCOPIC
Bilirubin Urine: NEGATIVE
Hgb urine dipstick: NEGATIVE
Ketones, ur: NEGATIVE
Leukocytes,Ua: NEGATIVE
Nitrite: NEGATIVE
RBC / HPF: NONE SEEN (ref 0–?)
Specific Gravity, Urine: 1.02 (ref 1.000–1.030)
Total Protein, Urine: NEGATIVE
Urine Glucose: NEGATIVE
Urobilinogen, UA: 0.2 (ref 0.0–1.0)
pH: 6 (ref 5.0–8.0)

## 2022-09-10 LAB — TSH: TSH: 2.74 u[IU]/mL (ref 0.35–5.50)

## 2022-09-10 LAB — LIPID PANEL
Cholesterol: 156 mg/dL (ref 0–200)
HDL: 56.5 mg/dL (ref >39.00)
LDL Cholesterol: 86 mg/dL (ref 0–99)
NonHDL: 99.99
Total CHOL/HDL Ratio: 3
Triglycerides: 69 mg/dL (ref 0.0–149.0)
VLDL: 13.8 mg/dL (ref 0.0–40.0)

## 2022-09-10 LAB — HEPATIC FUNCTION PANEL
ALT: 16 U/L (ref 0–53)
AST: 15 U/L (ref 0–37)
Albumin: 3.9 g/dL (ref 3.5–5.2)
Alkaline Phosphatase: 67 U/L (ref 39–117)
Bilirubin, Direct: 0.1 mg/dL (ref 0.0–0.3)
Total Bilirubin: 0.5 mg/dL (ref 0.2–1.2)
Total Protein: 6.3 g/dL (ref 6.0–8.3)

## 2022-09-10 LAB — MICROALBUMIN / CREATININE URINE RATIO
Creatinine,U: 129.2 mg/dL
Microalb Creat Ratio: 0.5 mg/g (ref 0.0–30.0)
Microalb, Ur: <0.7 mg/dL (ref 0.0–1.9)

## 2022-09-10 LAB — HEMOGLOBIN A1C: Hgb A1c MFr Bld: 6.1 % (ref 4.6–6.5)

## 2022-09-10 LAB — TESTOSTERONE: Testosterone: 342.45 ng/dL (ref 300.00–890.00)

## 2022-09-10 LAB — VITAMIN B12: Vitamin B-12: 637 pg/mL (ref 211–911)

## 2022-09-10 LAB — PSA: PSA: 3.92 ng/mL (ref 0.10–4.00)

## 2022-09-10 LAB — VITAMIN D 25 HYDROXY (VIT D DEFICIENCY, FRACTURES): VITD: 29.55 ng/mL — ABNORMAL LOW (ref 30.00–100.00)

## 2022-09-11 ENCOUNTER — Ambulatory Visit: Payer: BC Managed Care – PPO | Admitting: Internal Medicine

## 2022-09-11 VITALS — BP 122/78 | HR 73 | Temp 98.7°F | Ht 71.0 in | Wt 217.8 lb

## 2022-09-11 DIAGNOSIS — E785 Hyperlipidemia, unspecified: Secondary | ICD-10-CM

## 2022-09-11 DIAGNOSIS — N529 Male erectile dysfunction, unspecified: Secondary | ICD-10-CM | POA: Diagnosis not present

## 2022-09-11 DIAGNOSIS — Z0001 Encounter for general adult medical examination with abnormal findings: Secondary | ICD-10-CM

## 2022-09-11 DIAGNOSIS — R21 Rash and other nonspecific skin eruption: Secondary | ICD-10-CM | POA: Diagnosis not present

## 2022-09-11 DIAGNOSIS — R739 Hyperglycemia, unspecified: Secondary | ICD-10-CM

## 2022-09-11 DIAGNOSIS — N419 Inflammatory disease of prostate, unspecified: Secondary | ICD-10-CM | POA: Diagnosis not present

## 2022-09-11 DIAGNOSIS — Z1159 Encounter for screening for other viral diseases: Secondary | ICD-10-CM

## 2022-09-11 DIAGNOSIS — R972 Elevated prostate specific antigen [PSA]: Secondary | ICD-10-CM

## 2022-09-11 MED ORDER — SILDENAFIL CITRATE 100 MG PO TABS: 50.0000 mg | ORAL_TABLET | Freq: Every day | ORAL | 11 refills | Status: DC | PRN

## 2022-09-11 MED ORDER — TADALAFIL 20 MG PO TABS: ORAL_TABLET | ORAL | 11 refills | Status: DC

## 2022-09-11 NOTE — Progress Notes (Signed)
Patient ID: David Schneider, male   DOB: 12/30/1970, 52 y.o.   MRN: [id number]         Chief Complaint:: wellness exam and prostatitis acute, axillary rash, ED, hld, hyperglycemia       HPI:  David Schneider is a 52 y.o. male here for wellness exam; for tdap at pharmacy, declines covid booster, for hep C screen, o/w up to date               Also has worsening ED symptoms recently, now always improved with viagra but at times seems to need cialis instead.  Also has a mild itchy rash to right axillary area for unclear reason for the past wk, though may be somewhat improved today as he did change deodorant.  Also has improved prostatitis symptoms but still some urinary slower flow, now better with flomax recent addition but wondering how long to use this.  Pt denies chest pain, increased sob or doe, wheezing, orthopnea, PND, increased LE swelling, palpitations, dizziness or syncope.   Pt denies polydipsia, polyuria, or new focal neuro s/s.    Pt denies fever, wt loss, night sweats, loss of appetite, or other constitutional symptoms   Wt Readings from Last 3 Encounters:  09/11/22 217 lb 12.8 oz (98.8 kg)  08/21/22 224 lb 4 oz (101.7 kg)  07/25/22 226 lb (102.5 kg)   BP Readings from Last 3 Encounters:  09/11/22 122/78  08/21/22 134/82  07/25/22 122/80   Immunization History  Administered Date(s) Administered   PFIZER(Purple Top)SARS-COV-2 Vaccination 05/30/2019, 06/20/2019, 03/30/2020   Td 09/13/2009   Zoster Recombinat (Shingrix) 01/11/2022, 04/18/2022   Health Maintenance Due  Topic Date Due   DTaP/Tdap/Td (2 - Tdap) 09/14/2019      Past Medical History:  Diagnosis Date   Allergic rhinitis    seasonal   Allergy    ED (erectile dysfunction)    Hyperlipidemia    Past Surgical History:  Procedure Laterality Date   TONSILLECTOMY     WISDOM TOOTH EXTRACTION      reports that he has never smoked. He has never used smokeless tobacco. He reports current alcohol use of about 6.0  standard drinks of alcohol per week. He reports that he does not use drugs. family history includes Diabetes in his brother, maternal grandmother, and mother; Esophageal cancer in his maternal grandfather; Heart attack in his maternal grandmother and mother; Hyperlipidemia in his father and mother; Hypertension in his father; Throat cancer in his maternal grandfather; Transient ischemic attack in his mother. Allergies  Allergen Reactions   Fexofenadine-Pseudoephed Er     REACTION: headache   Current Outpatient Medications on File Prior to Visit  Medication Sig Dispense Refill   Cyanocobalamin (VITAMIN B-12) 1000 MCG SUBL PLACE ONE TABLET UNDER THE TONGUE DAILY 100 tablet 3   phentermine 30 MG capsule Take 1 capsule (30 mg total) by mouth every morning. 90 capsule 1   pravastatin (PRAVACHOL) 40 MG tablet TAKE 1 TABLET BY MOUTH AT BEDTIMe 90 tablet 3   predniSONE (DELTASONE) 20 MG tablet Take 2 tablets (40 mg total) by mouth daily with breakfast. 10 tablet 0   tamsulosin (FLOMAX) 0.4 MG CAPS capsule TAKE 1 CAPSULE BY MOUTH DAILY 90 capsule 2   triamcinolone cream (KENALOG) 0.1 % Apply 1 Application topically 2 (two) times daily. 30 g 0   meloxicam (MOBIC) 15 MG tablet TAKE ONE TABLET BY MOUTH DAILY AS NEEDED FOR PAIN (Patient not taking: Reported on 07/25/2022) 30 tablet 0  topiramate (TOPAMAX) 50 MG tablet Take 1 tablet (50 mg total) by mouth 2 (two) times daily. (Patient not taking: Reported on 08/21/2022) 180 tablet 1   No current facility-administered medications on file prior to visit.        ROS:  All others reviewed and negative.  Objective        PE:  BP 122/78 (BP Location: Left Arm, Patient Position: Sitting, Cuff Size: Normal)   Pulse 73   Temp 98.7 F (37.1 C) (Oral)   Ht 5\' 11"  (1.803 m)   Wt 217 lb 12.8 oz (98.8 kg)   SpO2 98%   BMI 30.38 kg/m                 Constitutional: Pt appears in NAD               HENT: Head: NCAT.                Right Ear: External ear normal.                  Left Ear: External ear normal.                Eyes: . Pupils are equal, round, and reactive to light. Conjunctivae and EOM are normal               Nose: without d/c or deformity               Neck: Neck supple. Gross normal ROM               Cardiovascular: Normal rate and regular rhythm.                 Pulmonary/Chest: Effort normal and breath sounds without rales or wheezing.                Abd:  Soft, NT, ND, + BS, no organomegaly               Neurological: Pt is alert. At baseline orientation, motor grossly intact               Skin: Skin is warm. No rashes, no other new lesions, LE edema - none               Psychiatric: Pt behavior is normal without agitation   Micro: none  Cardiac tracings I have personally interpreted today:  none  Pertinent Radiological findings (summarize): none   Lab Results  Component Value Date   WBC 7.0 09/10/2022   HGB 14.5 09/10/2022   HCT 43.3 09/10/2022   PLT 185.0 09/10/2022   GLUCOSE 107 (H) 09/10/2022   CHOL 156 09/10/2022   TRIG 69.0 09/10/2022   HDL 56.50 09/10/2022   LDLDIRECT 157.4 06/23/2007   LDLCALC 86 09/10/2022   ALT 16 09/10/2022   AST 15 09/10/2022   NA 141 09/10/2022   K 4.8 09/10/2022   CL 105 09/10/2022   CREATININE 0.88 09/10/2022   BUN 12 09/10/2022   CO2 29 09/10/2022   TSH 2.74 09/10/2022   PSA 3.92 09/10/2022   HGBA1C 6.1 09/10/2022   MICROALBUR <0.7 09/10/2022   Assessment/Plan:  SHEPARD SADD is a 52 y.o. White or Caucasian [1] male with  has a past medical history of Allergic rhinitis, Allergy, ED (erectile dysfunction), and Hyperlipidemia.  Encounter for well adult exam with abnormal findings Age and sex appropriate education and counseling updated with regular exercise and diet Referrals for preventative services -  for hep c screen Immunizations addressed - declines covid booster, for tdap at pharmacy Smoking counseling  - none needed Evidence for depression or other mood disorder -  none significant Most recent labs reviewed. I have personally reviewed and have noted: 1) the patient's medical and social history 2) The patient's current medications and supplements 3) The patient's height, weight, and BMI have been recorded in the chart   HLD (hyperlipidemia) Lab Results  Component Value Date   LDLCALC 86 09/10/2022   Stable, pt to continue current statin pravachol 40 mg qd   Hyperglycemia Lab Results  Component Value Date   HGBA1C 6.1 09/10/2022   Stable, pt to continue current medical treatment  - diet, wt control   Prostatitis Improving, and expect to use flomax daily for at least next 3 wks, then trial off  Rash Maybe somewhat improved with change of deodorant, consider antifungal tx if not resolved soon  Erectile dysfunction With recent worsening, for viagra / cialis prn  Followup: Return in about 3 months (around 12/12/2022).  Oliver Barre, MD 09/13/2022 5:15 PM [facility]

## 2022-09-11 NOTE — Patient Instructions (Signed)
Ok to continue the flomax for at least 3 wks then try to stop  Please call for an antifungal cream if the armpit rash does not resolve with changing the deodorant  Please continue all other medications as before, and refills have been done if requested.  Please have the pharmacy call with any other refills you may need.  Please continue your efforts at being more active, low cholesterol diet, and weight control.  You are otherwise up to date with prevention measures today.  Please keep your appointments with your specialists as you may have planned  Please make an Appointment to return in 3 months, or sooner if needed, also with Lab Appointment for testing done 3-5 days before at the FIRST FLOOR Lab (so this is for TWO appointments - please see the scheduling desk as you leave)

## 2022-09-13 ENCOUNTER — Encounter: Payer: Self-pay | Admitting: Internal Medicine

## 2022-09-13 DIAGNOSIS — N419 Inflammatory disease of prostate, unspecified: Secondary | ICD-10-CM | POA: Insufficient documentation

## 2022-09-13 DIAGNOSIS — R21 Rash and other nonspecific skin eruption: Secondary | ICD-10-CM | POA: Insufficient documentation

## 2022-09-13 NOTE — Assessment & Plan Note (Signed)
With recent worsening, for viagra / cialis prn

## 2022-09-13 NOTE — Assessment & Plan Note (Signed)
Improving, and expect to use flomax daily for at least next 3 wks, then trial off

## 2022-09-13 NOTE — Assessment & Plan Note (Signed)
Maybe somewhat improved with change of deodorant, consider antifungal tx if not resolved soon

## 2022-09-13 NOTE — Assessment & Plan Note (Signed)
Lab Results  Component Value Date   LDLCALC 86 09/10/2022   Stable, pt to continue current statin pravachol 40 mg qd

## 2022-09-13 NOTE — Assessment & Plan Note (Signed)
Age and sex appropriate education and counseling updated with regular exercise and diet Referrals for preventative services - for hep c screen Immunizations addressed - declines covid booster, for tdap at pharmacy Smoking counseling  - none needed Evidence for depression or other mood disorder - none significant Most recent labs reviewed. I have personally reviewed and have noted: 1) the patient's medical and social history 2) The patient's current medications and supplements 3) The patient's height, weight, and BMI have been recorded in the chart

## 2022-09-13 NOTE — Assessment & Plan Note (Signed)
Lab Results  Component Value Date   HGBA1C 6.1 09/10/2022   Stable, pt to continue current medical treatment  - diet, wt control

## 2022-10-15 ENCOUNTER — Encounter: Payer: Self-pay | Admitting: Internal Medicine

## 2022-10-15 MED ORDER — SCOPOLAMINE 1 MG/3DAYS TD PT72: 1.0000 | MEDICATED_PATCH | TRANSDERMAL | 0 refills | Status: DC

## 2022-11-06 ENCOUNTER — Ambulatory Visit: Payer: BC Managed Care – PPO | Admitting: Internal Medicine

## 2022-11-06 DIAGNOSIS — R410 Disorientation, unspecified: Secondary | ICD-10-CM

## 2022-11-06 DIAGNOSIS — R42 Dizziness and giddiness: Secondary | ICD-10-CM

## 2022-11-06 DIAGNOSIS — R739 Hyperglycemia, unspecified: Secondary | ICD-10-CM | POA: Diagnosis not present

## 2022-11-06 DIAGNOSIS — R030 Elevated blood-pressure reading, without diagnosis of hypertension: Secondary | ICD-10-CM | POA: Diagnosis not present

## 2022-11-06 DIAGNOSIS — F419 Anxiety disorder, unspecified: Secondary | ICD-10-CM

## 2022-11-06 MED ORDER — ONDANSETRON 4 MG PO TBDP: 4.0000 mg | ORAL_TABLET | Freq: Three times a day (TID) | ORAL | 1 refills | Status: DC | PRN

## 2022-11-06 MED ORDER — MECLIZINE HCL 12.5 MG PO TABS: 12.5000 mg | ORAL_TABLET | Freq: Three times a day (TID) | ORAL | 2 refills | Status: AC | PRN

## 2022-11-06 NOTE — Progress Notes (Signed)
Patient ID: David Schneider, male   DOB: 11/02/1970, 52 y.o.   MRN: [id number]        Chief Complaint: follow up vertigo, htn, confusion, hyperglycemia, anxiety       HPI:  David Schneider is a 52 y.o. male here with c/o positional vertigo ongoing for 3 days since home from trip to [place], had some difficutly with flights and getting home, several delays,  wife with him has no complaints.  Has hx of allergies but not really worsening and symptomatic recently.  Pt denies chest pain, increased sob or doe, wheezing, orthopnea, PND, increased LE swelling, palpitations, or syncope.   Pt denies polydipsia, polyuria, or new focal neuro s/ but did have mild confusion after placing a scopalamine patch to avoid sea sickness, resolved quickly in 1-2 days after patch removed and not returned.  BP has been controlled at home.     Wt Readings from Last 3 Encounters:  11/06/22 219 lb 6.4 oz (99.5 kg)  09/11/22 217 lb 12.8 oz (98.8 kg)  08/21/22 224 lb 4 oz (101.7 kg)   BP Readings from Last 3 Encounters:  11/06/22 (!) 150/88  09/11/22 122/78  08/21/22 134/82         Past Medical History:  Diagnosis Date   Allergic rhinitis    seasonal   Allergy    ED (erectile dysfunction)    Hyperlipidemia    Past Surgical History:  Procedure Laterality Date   TONSILLECTOMY     WISDOM TOOTH EXTRACTION      reports that he has never smoked. He has never used smokeless tobacco. He reports current alcohol use of about 6.0 standard drinks of alcohol per week. He reports that he does not use drugs. family history includes Diabetes in his brother, maternal grandmother, and mother; Esophageal cancer in his maternal grandfather; Heart attack in his maternal grandmother and mother; Hyperlipidemia in his father and mother; Hypertension in his father; Throat cancer in his maternal grandfather; Transient ischemic attack in his mother. Allergies  Allergen Reactions   Transderm-Scop [Scopolamine]     confusion    Fexofenadine-Pseudoephed Er     REACTION: headache   Current Outpatient Medications on File Prior to Visit  Medication Sig Dispense Refill   Cyanocobalamin (VITAMIN B-12) 1000 MCG SUBL PLACE ONE TABLET UNDER THE TONGUE DAILY 100 tablet 3   meloxicam (MOBIC) 15 MG tablet TAKE ONE TABLET BY MOUTH DAILY AS NEEDED FOR PAIN (Patient not taking: Reported on 07/25/2022) 30 tablet 0   phentermine 30 MG capsule Take 1 capsule (30 mg total) by mouth every morning. 90 capsule 1   pravastatin (PRAVACHOL) 40 MG tablet TAKE 1 TABLET BY MOUTH AT BEDTIMe 90 tablet 3   predniSONE (DELTASONE) 20 MG tablet Take 2 tablets (40 mg total) by mouth daily with breakfast. 10 tablet 0   scopolamine (TRANSDERM SCOP, 1.5 MG,) 1 MG/3DAYS Place 1 patch (1.5 mg total) onto the skin every 3 (three) days. 10 patch 0   sildenafil (VIAGRA) 100 MG tablet Take 0.5-1 tablets (50-100 mg total) by mouth daily as needed for erectile dysfunction. 5 tablet 11   tadalafil (CIALIS) 20 MG tablet TAKE 1 TABLET BY MOUTH EVERY 3 DAYS AS NEEDED 30 tablet 11   tamsulosin (FLOMAX) 0.4 MG CAPS capsule TAKE 1 CAPSULE BY MOUTH DAILY 90 capsule 2   topiramate (TOPAMAX) 50 MG tablet Take 1 tablet (50 mg total) by mouth 2 (two) times daily. (Patient not taking: Reported on 08/21/2022) 180 tablet 1  triamcinolone cream (KENALOG) 0.1 % Apply 1 Application topically 2 (two) times daily. 30 g 0   No current facility-administered medications on file prior to visit.        ROS:  All others reviewed and negative.  Objective        PE:  BP (!) 150/88   Pulse 68   Temp 97.6 F (36.4 C)   Ht 5\' 11"  (1.803 m)   Wt 219 lb 6.4 oz (99.5 kg)   SpO2 98%   BMI 30.60 kg/m                 Constitutional: Pt appears in NAD               HENT: Head: NCAT.                Right Ear: External ear normal.                 Left Ear: External ear normal.                Eyes: . Pupils are equal, round, and reactive to light. Conjunctivae and EOM are normal                Nose: without d/c or deformity               Neck: Neck supple. Gross normal ROM               Cardiovascular: Normal rate and regular rhythm.                 Pulmonary/Chest: Effort normal and breath sounds without rales or wheezing.                Abd:  Soft, NT, ND, + BS, no organomegaly               Neurological: Pt is alert. At baseline orientation, motor grossly intact               Skin: Skin is warm. No rashes, no other new lesions, LE edema - none               Psychiatric: Pt behavior is normal without agitation , mild nervous  Micro: none  Cardiac tracings I have personally interpreted today:  none  Pertinent Radiological findings (summarize): none   Lab Results  Component Value Date   WBC 7.0 09/10/2022   HGB 14.5 09/10/2022   HCT 43.3 09/10/2022   PLT 185.0 09/10/2022   GLUCOSE 107 (H) 09/10/2022   CHOL 156 09/10/2022   TRIG 69.0 09/10/2022   HDL 56.50 09/10/2022   LDLDIRECT 157.4 06/23/2007   LDLCALC 86 09/10/2022   ALT 16 09/10/2022   AST 15 09/10/2022   NA 141 09/10/2022   K 4.8 09/10/2022   CL 105 09/10/2022   CREATININE 0.88 09/10/2022   BUN 12 09/10/2022   CO2 29 09/10/2022   TSH 2.74 09/10/2022   PSA 3.92 09/10/2022   HGBA1C 6.1 09/10/2022   MICROALBUR <0.7 09/10/2022   Assessment/Plan:  David Schneider is a 52 y.o. White or Caucasian [1] male with  has a past medical history of Allergic rhinitis, Allergy, ED (erectile dysfunction), and Hyperlipidemia.  Blood pressure elevated without history of HTN BP Readings from Last 3 Encounters:  11/06/22 (!) 150/88  09/11/22 122/78  08/21/22 134/82   Uncontrolled, likely situational, pt to continue medical treatment  - diet, wt control, continue to monitor  Vertigo C/w BPPV - exam benign, for antivert prn  Confusion Mild transient short lived that seem obviously related to scopalamine patch use, should avoid this in future.    Hyperglycemia Lab Results  Component Value Date   HGBA1C 6.1  09/10/2022   Stable, pt to continue current medical treatment  - diet, wt control   Anxiety Mild situational, pt reassurd, declines need for change in tx at this time  Followup: Return if symptoms worsen or fail to improve.  Oliver Barre, MD 11/08/2022 11:37 AM [facility] - [facility]

## 2022-11-06 NOTE — Patient Instructions (Addendum)
Please take all new medication as prescribed - the meclizine as needed for vertigo, and also zofran as needed for nausea  Please try the [facility] maneuvers easily found on [employer] to help as well  Please continue all other medications as before, and refills have been done if requested.  Please have the pharmacy call with any other refills you may need  Please keep your appointments with your specialists as you may have planned

## 2022-11-08 ENCOUNTER — Encounter: Payer: Self-pay | Admitting: Internal Medicine

## 2022-11-08 DIAGNOSIS — R42 Dizziness and giddiness: Secondary | ICD-10-CM | POA: Insufficient documentation

## 2022-11-08 DIAGNOSIS — R410 Disorientation, unspecified: Secondary | ICD-10-CM | POA: Insufficient documentation

## 2022-11-08 NOTE — Assessment & Plan Note (Signed)
Mild transient short lived that seem obviously related to scopalamine patch use, should avoid this in future.

## 2022-11-08 NOTE — Assessment & Plan Note (Signed)
BP Readings from Last 3 Encounters:  11/06/22 (!) 150/88  09/11/22 122/78  08/21/22 134/82   Uncontrolled, likely situational, pt to continue medical treatment  - diet, wt control, continue to monitor

## 2022-11-08 NOTE — Assessment & Plan Note (Signed)
Mild situational, pt reassurd, declines need for change in tx at this time

## 2022-11-08 NOTE — Assessment & Plan Note (Signed)
Lab Results  Component Value Date   HGBA1C 6.1 09/10/2022   Stable, pt to continue current medical treatment  - diet, wt control

## 2022-11-08 NOTE — Assessment & Plan Note (Signed)
C/w BPPV - exam benign, for antivert prn

## 2023-01-09 ENCOUNTER — Ambulatory Visit: Payer: BC Managed Care – PPO | Admitting: Internal Medicine

## 2023-01-09 ENCOUNTER — Encounter: Payer: Self-pay | Admitting: Internal Medicine

## 2023-01-09 VITALS — BP 120/72 | HR 67 | Temp 99.0°F | Ht 71.0 in | Wt 224.0 lb

## 2023-01-09 DIAGNOSIS — E559 Vitamin D deficiency, unspecified: Secondary | ICD-10-CM

## 2023-01-09 DIAGNOSIS — E785 Hyperlipidemia, unspecified: Secondary | ICD-10-CM | POA: Diagnosis not present

## 2023-01-09 DIAGNOSIS — E66811 Obesity, class 1: Secondary | ICD-10-CM

## 2023-01-09 DIAGNOSIS — E538 Deficiency of other specified B group vitamins: Secondary | ICD-10-CM

## 2023-01-09 DIAGNOSIS — R42 Dizziness and giddiness: Secondary | ICD-10-CM | POA: Diagnosis not present

## 2023-01-09 DIAGNOSIS — E7849 Other hyperlipidemia: Secondary | ICD-10-CM

## 2023-01-09 DIAGNOSIS — N529 Male erectile dysfunction, unspecified: Secondary | ICD-10-CM | POA: Diagnosis not present

## 2023-01-09 DIAGNOSIS — R030 Elevated blood-pressure reading, without diagnosis of hypertension: Secondary | ICD-10-CM

## 2023-01-09 DIAGNOSIS — Z1159 Encounter for screening for other viral diseases: Secondary | ICD-10-CM | POA: Diagnosis not present

## 2023-01-09 DIAGNOSIS — R739 Hyperglycemia, unspecified: Secondary | ICD-10-CM | POA: Diagnosis not present

## 2023-01-09 DIAGNOSIS — R972 Elevated prostate specific antigen [PSA]: Secondary | ICD-10-CM

## 2023-01-09 LAB — BASIC METABOLIC PANEL
BUN: 12 mg/dL (ref 6–23)
CO2: 28 meq/L (ref 19–32)
Calcium: 9.6 mg/dL (ref 8.4–10.5)
Chloride: 103 meq/L (ref 96–112)
Creatinine, Ser: 0.88 mg/dL (ref 0.40–1.50)
GFR: 97.47 mL/min (ref >60.00)
Glucose, Bld: 116 mg/dL — ABNORMAL HIGH (ref 70–99)
Potassium: 4.3 meq/L (ref 3.5–5.1)
Sodium: 140 meq/L (ref 135–145)

## 2023-01-09 LAB — HEPATIC FUNCTION PANEL
ALT: 18 U/L (ref 0–53)
AST: 17 U/L (ref 0–37)
Albumin: 4.2 g/dL (ref 3.5–5.2)
Alkaline Phosphatase: 73 U/L (ref 39–117)
Bilirubin, Direct: 0.1 mg/dL (ref 0.0–0.3)
Total Bilirubin: 0.5 mg/dL (ref 0.2–1.2)
Total Protein: 6.4 g/dL (ref 6.0–8.3)

## 2023-01-09 LAB — CBC WITH DIFFERENTIAL/PLATELET
Basophils Absolute: 0 10*3/uL (ref 0.0–0.1)
Basophils Relative: 0.4 % (ref 0.0–3.0)
Eosinophils Absolute: 0.1 10*3/uL (ref 0.0–0.7)
Eosinophils Relative: 2.3 % (ref 0.0–5.0)
HCT: 46.1 % (ref 39.0–52.0)
Hemoglobin: 15.2 g/dL (ref 13.0–17.0)
Lymphocytes Relative: 24.2 % (ref 12.0–46.0)
Lymphs Abs: 1.3 10*3/uL (ref 0.7–4.0)
MCHC: 33 g/dL (ref 30.0–36.0)
MCV: 86.7 fL (ref 78.0–100.0)
Monocytes Absolute: 0.5 10*3/uL (ref 0.1–1.0)
Monocytes Relative: 9.9 % (ref 3.0–12.0)
Neutro Abs: 3.3 10*3/uL (ref 1.4–7.7)
Neutrophils Relative %: 63.2 % (ref 43.0–77.0)
Platelets: 220 10*3/uL (ref 150.0–400.0)
RBC: 5.32 Mil/uL (ref 4.22–5.81)
RDW: 13.7 % (ref 11.5–15.5)
WBC: 5.2 10*3/uL (ref 4.0–10.5)

## 2023-01-09 LAB — URINALYSIS, ROUTINE W REFLEX MICROSCOPIC
Bilirubin Urine: NEGATIVE
Hgb urine dipstick: NEGATIVE
Ketones, ur: NEGATIVE
Leukocytes,Ua: NEGATIVE
Nitrite: NEGATIVE
Specific Gravity, Urine: 1.02 (ref 1.000–1.030)
Total Protein, Urine: NEGATIVE
Urine Glucose: NEGATIVE
Urobilinogen, UA: 0.2 (ref 0.0–1.0)
WBC, UA: NONE SEEN — AB (ref 0–?)
pH: 6 (ref 5.0–8.0)

## 2023-01-09 LAB — LIPID PANEL
Cholesterol: 171 mg/dL (ref 0–200)
HDL: 49.3 mg/dL (ref >39.00)
LDL Cholesterol: 105 mg/dL — ABNORMAL HIGH (ref 0–99)
NonHDL: 122.13
Total CHOL/HDL Ratio: 3
Triglycerides: 87 mg/dL (ref 0.0–149.0)
VLDL: 17.4 mg/dL (ref 0.0–40.0)

## 2023-01-09 LAB — HEMOGLOBIN A1C: Hgb A1c MFr Bld: 6.2 % (ref 4.6–6.5)

## 2023-01-09 LAB — VITAMIN B12: Vitamin B-12: 440 pg/mL (ref 211–911)

## 2023-01-09 LAB — PSA: PSA: 2.88 ng/mL (ref 0.10–4.00)

## 2023-01-09 LAB — VITAMIN D 25 HYDROXY (VIT D DEFICIENCY, FRACTURES): VITD: 31.7 ng/mL (ref 30.00–100.00)

## 2023-01-09 LAB — TSH: TSH: 2.52 u[IU]/mL (ref 0.35–5.50)

## 2023-01-09 LAB — TESTOSTERONE: Testosterone: 305.99 ng/dL (ref 300.00–890.00)

## 2023-01-09 MED ORDER — PRAVASTATIN SODIUM 40 MG PO TABS: ORAL_TABLET | ORAL | 3 refills | Status: DC

## 2023-01-09 MED ORDER — TAMSULOSIN HCL 0.4 MG PO CAPS: 0.4000 mg | ORAL_CAPSULE | Freq: Every day | ORAL | 3 refills | Status: DC

## 2023-01-09 NOTE — Assessment & Plan Note (Signed)
Ok to hold on phentermine / topamax for now, cont low calorie diet

## 2023-01-09 NOTE — Progress Notes (Signed)
Patient ID: David Schneider, male   DOB: March 27, 1971, 52 y.o.   MRN: [id number]        Chief Complaint: follow up hld, hyperglycemia, elev BP, obesity, vertigo       HPI:  David Schneider is a 52 y.o. male here overall doing ok.  Pt denies chest pain, increased sob or doe, wheezing, orthopnea, PND, increased LE swelling, palpitations, dizziness or syncope.   Pt denies polydipsia, polyuria, or new focal neuro s/s.    Pt denies fever, wt loss, night sweats, loss of appetite, or other constitutional symptoms   Vertigo resolved.  Peak wt has been 249 in past.  Low of 216 at home recently. Lost wt with phentermine and topamax and having holiday for now, may want to restart after jan 1.   Wt Readings from Last 3 Encounters:  01/09/23 224 lb (101.6 kg)  11/06/22 219 lb 6.4 oz (99.5 kg)  09/11/22 217 lb 12.8 oz (98.8 kg)   BP Readings from Last 3 Encounters:  01/09/23 120/72  11/06/22 (!) 150/88  09/11/22 122/78         Past Medical History:  Diagnosis Date   Allergic rhinitis    seasonal   Allergy    ED (erectile dysfunction)    Hyperlipidemia    Past Surgical History:  Procedure Laterality Date   TONSILLECTOMY     WISDOM TOOTH EXTRACTION      reports that he has never smoked. He has never used smokeless tobacco. He reports current alcohol use of about 6.0 standard drinks of alcohol per week. He reports that he does not use drugs. family history includes Diabetes in his brother, maternal grandmother, and mother; Esophageal cancer in his maternal grandfather; Heart attack in his maternal grandmother and mother; Hyperlipidemia in his father and mother; Hypertension in his father; Throat cancer in his maternal grandfather; Transient ischemic attack in his mother. Allergies  Allergen Reactions   Transderm-Scop [Scopolamine]     confusion   Fexofenadine-Pseudoephed Er     REACTION: headache   Current Outpatient Medications on File Prior to Visit  Medication Sig Dispense Refill    Cyanocobalamin (VITAMIN B-12) 1000 MCG SUBL PLACE ONE TABLET UNDER THE TONGUE DAILY 100 tablet 3   meclizine (ANTIVERT) 12.5 MG tablet Take 1 tablet (12.5 mg total) by mouth 3 (three) times daily as needed for dizziness. 30 tablet 2   ondansetron (ZOFRAN-ODT) 4 MG disintegrating tablet Take 1 tablet (4 mg total) by mouth every 8 (eight) hours as needed for nausea or vomiting. 20 tablet 1   phentermine 30 MG capsule Take 1 capsule (30 mg total) by mouth every morning. 90 capsule 1   sildenafil (VIAGRA) 100 MG tablet Take 0.5-1 tablets (50-100 mg total) by mouth daily as needed for erectile dysfunction. 5 tablet 11   tadalafil (CIALIS) 20 MG tablet TAKE 1 TABLET BY MOUTH EVERY 3 DAYS AS NEEDED 30 tablet 11   triamcinolone cream (KENALOG) 0.1 % Apply 1 Application topically 2 (two) times daily. 30 g 0   meloxicam (MOBIC) 15 MG tablet TAKE ONE TABLET BY MOUTH DAILY AS NEEDED FOR PAIN (Patient not taking: Reported on 07/25/2022) 30 tablet 0   topiramate (TOPAMAX) 50 MG tablet Take 1 tablet (50 mg total) by mouth 2 (two) times daily. (Patient not taking: Reported on 08/21/2022) 180 tablet 1   No current facility-administered medications on file prior to visit.        ROS:  All others reviewed and negative.  Objective  PE:  BP 120/72 (BP Location: Right Arm, Patient Position: Sitting, Cuff Size: Normal)   Pulse 67   Temp 99 F (37.2 C) (Oral)   Ht 5\' 11"  (1.803 m)   Wt 224 lb (101.6 kg)   SpO2 99%   BMI 31.24 kg/m                 Constitutional: Pt appears in NAD               HENT: Head: NCAT.                Right Ear: External ear normal.                 Left Ear: External ear normal.                Eyes: . Pupils are equal, round, and reactive to light. Conjunctivae and EOM are normal               Nose: without d/c or deformity               Neck: Neck supple. Gross normal ROM               Cardiovascular: Normal rate and regular rhythm.                 Pulmonary/Chest: Effort  normal and breath sounds without rales or wheezing.                Abd:  Soft, NT, ND, + BS, no organomegaly               Neurological: Pt is alert. At baseline orientation, motor grossly intact               Skin: Skin is warm. No rashes, no other new lesions, LE edema - none               Psychiatric: Pt behavior is normal without agitation   Micro: none  Cardiac tracings I have personally interpreted today:  none  Pertinent Radiological findings (summarize): none   Lab Results  Component Value Date   WBC 5.2 01/09/2023   HGB 15.2 01/09/2023   HCT 46.1 01/09/2023   PLT 220.0 01/09/2023   GLUCOSE 116 (H) 01/09/2023   CHOL 171 01/09/2023   TRIG 87.0 01/09/2023   HDL 49.30 01/09/2023   LDLDIRECT 157.4 06/23/2007   LDLCALC 105 (H) 01/09/2023   ALT 18 01/09/2023   AST 17 01/09/2023   NA 140 01/09/2023   K 4.3 01/09/2023   CL 103 01/09/2023   CREATININE 0.88 01/09/2023   BUN 12 01/09/2023   CO2 28 01/09/2023   TSH 2.52 01/09/2023   PSA 2.88 01/09/2023   HGBA1C 6.2 01/09/2023   MICROALBUR <0.7 09/10/2022   Assessment/Plan:  David Schneider is a 52 y.o. White or Caucasian [1] male with  has a past medical history of Allergic rhinitis, Allergy, ED (erectile dysfunction), and Hyperlipidemia.  Obesity (BMI 30.0-34.9) Ok to hold on phentermine / topamax for now, cont low calorie diet  HLD (hyperlipidemia) Lab Results  Component Value Date   LDLCALC 105 (H) 01/09/2023   Uncontrolled,, pt to continue current pravastatin 40 mg, goal ldl < 100, consider card CT score, decines other change for now   Hyperglycemia Lab Results  Component Value Date   HGBA1C 6.2 01/09/2023   Stable, pt to continue current medical treatment  - diet, wt control   Blood pressure  elevated without history of HTN BP Readings from Last 3 Encounters:  01/09/23 120/72  11/06/22 (!) 150/88  09/11/22 122/78   Improved now stable, pt to continue medical treatment  - diet, wt  control   Vertigo Improved, likely middle ear related,  to f/u any worsening symptoms or concerns  Erectile dysfunction With mild worsening, for restart viagra or cialis prn  Followup: Return in about 1 year (around 01/09/2024).  David Barre, MD 01/11/2023 2:50 PM [facility] [place] [facility] - [facility]

## 2023-01-09 NOTE — Patient Instructions (Signed)
Please continue all other medications as before, and refills have been done if requested.  Please have the pharmacy call with any other refills you may need.  Please continue your efforts at being more active, low cholesterol diet, and weight control.  You are otherwise up to date with prevention measures today.  Please keep your appointments with your specialists as you may have planned  Please go to the LAB at the blood drawing area for the tests to be done  You will be contacted by phone if any changes need to be made immediately.  Otherwise, you will receive a letter about your results with an explanation, but please check with MyChart first.  Please make an Appointment to return for your 1 year visit, or sooner if needed 

## 2023-01-10 LAB — HEPATITIS C ANTIBODY: Hepatitis C Ab: NONREACTIVE

## 2023-01-11 ENCOUNTER — Encounter: Payer: Self-pay | Admitting: Internal Medicine

## 2023-01-11 NOTE — Assessment & Plan Note (Signed)
With mild worsening, for restart viagra or cialis prn

## 2023-01-11 NOTE — Assessment & Plan Note (Signed)
BP Readings from Last 3 Encounters:  01/09/23 120/72  11/06/22 (!) 150/88  09/11/22 122/78   Improved now stable, pt to continue medical treatment  - diet, wt control

## 2023-01-11 NOTE — Assessment & Plan Note (Signed)
Improved, likely middle ear related,  to f/u any worsening symptoms or concerns

## 2023-01-11 NOTE — Assessment & Plan Note (Signed)
Lab Results  Component Value Date   LDLCALC 105 (H) 01/09/2023   Uncontrolled,, pt to continue current pravastatin 40 mg, goal ldl < 100, consider card CT score, decines other change for now

## 2023-01-11 NOTE — Assessment & Plan Note (Signed)
Lab Results  Component Value Date   HGBA1C 6.2 01/09/2023   Stable, pt to continue current medical treatment  - diet, wt control

## 2023-02-04 ENCOUNTER — Encounter: Payer: Self-pay | Admitting: Internal Medicine

## 2023-06-14 ENCOUNTER — Ambulatory Visit
Admission: EM | Admit: 2023-06-14 | Discharge: 2023-06-14 | Disposition: A | Discharge status: Home / Self Care | Attending: Emergency Medicine | Admitting: Emergency Medicine

## 2023-06-14 ENCOUNTER — Ambulatory Visit (INDEPENDENT_AMBULATORY_CARE_PROVIDER_SITE_OTHER): Admitting: Radiology

## 2023-06-14 ENCOUNTER — Encounter: Payer: Self-pay | Admitting: Emergency Medicine

## 2023-06-14 DIAGNOSIS — M25462 Effusion, left knee: Secondary | ICD-10-CM | POA: Diagnosis not present

## 2023-06-14 DIAGNOSIS — M25562 Pain in left knee: Secondary | ICD-10-CM

## 2023-06-14 MED ORDER — ACETAMINOPHEN 325 MG PO TABS
650.0000 mg | ORAL_TABLET | Freq: Once | ORAL | Status: AC
  Administered 2023-06-14: 650 mg via ORAL

## 2023-06-14 NOTE — ED Provider Notes (Signed)
 [facility]    CSN: [id number] Arrival date & time: 06/14/23  1100      History   Chief Complaint Chief Complaint  Patient presents with   Knee Pain    HPI David Schneider is a 53 y.o. male.   Patient presents to clinic for left medial knee pain after a fall last night.  He was tossing some toys out in the yard to the dogs when he was walking backwards and twisted his right ankle inward.  At the same time he twisted his left knee and fell backwards.  He did hit his head on his wife's car, did not have any loss of consciousness.  Is ambulatory with medial knee pain.  Has not had any obvious swelling.  Denies any popping with injury.  Was unable to sleep last night due to the knee pain.  He had 800 mg of ibuprofen prior to arrival.  The history is provided by the patient and medical records.  Knee Pain   Past Medical History:  Diagnosis Date   Allergic rhinitis    seasonal   Allergy    ED (erectile dysfunction)    Hyperlipidemia     Patient Active Problem List   Diagnosis Date Noted   Vertigo 11/08/2022   Confusion 11/08/2022   Prostatitis 09/13/2022   Rash 09/13/2022   Obesity (BMI 30.0-34.9) 06/06/2022   Blood pressure elevated without history of HTN 08/16/2019   Left elbow pain 04/03/2019   Low back pain 02/04/2018   Anxiety 02/04/2018   Encounter for well adult exam with abnormal findings 03/06/2016   Costochondritis 11/09/2014   Erectile dysfunction 09/13/2009   Allergic rhinitis 09/13/2009   Hyperglycemia 12/12/2008   HLD (hyperlipidemia) 06/23/2007    Past Surgical History:  Procedure Laterality Date   TONSILLECTOMY     WISDOM TOOTH EXTRACTION         Home Medications    Prior to Admission medications   Medication Sig Start Date End Date Taking? Authorizing Provider  Cyanocobalamin (VITAMIN B-12) 1000 MCG SUBL PLACE ONE TABLET UNDER THE TONGUE DAILY 01/03/22   Corwin Levins, MD  meclizine (ANTIVERT) 12.5 MG tablet Take 1 tablet  (12.5 mg total) by mouth 3 (three) times daily as needed for dizziness. 11/06/22 11/06/23  Corwin Levins, MD  meloxicam (MOBIC) 15 MG tablet TAKE ONE TABLET BY MOUTH DAILY AS NEEDED FOR PAIN Patient not taking: Reported on 07/25/2022 10/13/21   Corwin Levins, MD  ondansetron (ZOFRAN-ODT) 4 MG disintegrating tablet Take 1 tablet (4 mg total) by mouth every 8 (eight) hours as needed for nausea or vomiting. 11/06/22   Corwin Levins, MD  phentermine 30 MG capsule Take 1 capsule (30 mg total) by mouth every morning. 06/06/22   Corwin Levins, MD  pravastatin (PRAVACHOL) 40 MG tablet TAKE 1 TABLET BY MOUTH AT BEDTIMe 01/09/23   Corwin Levins, MD  sildenafil (VIAGRA) 100 MG tablet Take 0.5-1 tablets (50-100 mg total) by mouth daily as needed for erectile dysfunction. 09/11/22   Corwin Levins, MD  tadalafil (CIALIS) 20 MG tablet TAKE 1 TABLET BY MOUTH EVERY 3 DAYS AS NEEDED 09/11/22   Corwin Levins, MD  tamsulosin (FLOMAX) 0.4 MG CAPS capsule Take 1 capsule (0.4 mg total) by mouth daily. 01/09/23   Corwin Levins, MD  topiramate (TOPAMAX) 50 MG tablet Take 1 tablet (50 mg total) by mouth 2 (two) times daily. Patient not taking: Reported on 08/21/2022 06/06/22   Jonny Ruiz,  Len Blalock, MD  triamcinolone cream (KENALOG) 0.1 % Apply 1 Application topically 2 (two) times daily. 08/14/22 08/14/23  Corwin Levins, MD    Family History Family History  Problem Relation Age of Onset   Hypertension Father    Hyperlipidemia Father    Transient ischemic attack Mother    Diabetes Mother    Hyperlipidemia Mother    Heart attack Mother        2006; stents earely 60s   Diabetes Brother    Diabetes Maternal Grandmother    Heart attack Maternal Grandmother        70s   Throat cancer Maternal Grandfather        history of ETOH & smoking   Esophageal cancer Maternal Grandfather    Colon cancer Neg Hx    Stomach cancer Neg Hx    Rectal cancer Neg Hx     Social History Social History   Tobacco Use   Smoking status: Never   Smokeless  tobacco: Never  Vaping Use   Vaping status: Never Used  Substance Use Topics   Alcohol use: Yes    Alcohol/week: 6.0 standard drinks of alcohol    Types: 6 Cans of beer per week    Comment: socially only   Drug use: No     Allergies   Transderm-scop [scopolamine] and Fexofenadine-pseudoephed er   Review of Systems Review of Systems  Per HPI  Physical Exam Triage Vital Signs ED Triage Vitals [06/14/23 1118]  Encounter Vitals Group     BP 135/76     Systolic BP Percentile      Diastolic BP Percentile      Pulse Rate 65     Resp 17     Temp 97.7 F (36.5 C)     Temp Source Oral     SpO2 95 %     Weight      Height      Head Circumference      Peak Flow      Pain Score      Pain Loc      Pain Education      Exclude from Growth Chart    No data found.  Updated Vital Signs BP 135/76 (BP Location: Right Arm)   Pulse 65   Temp 97.7 F (36.5 C) (Oral)   Resp 17   SpO2 95%   Visual Acuity Right Eye Distance:   Left Eye Distance:   Bilateral Distance:    Right Eye Near:   Left Eye Near:    Bilateral Near:     Physical Exam Vitals and nursing note reviewed.  Constitutional:      Appearance: Normal appearance.  HENT:     Head: Normocephalic and atraumatic.     Right Ear: External ear normal.     Left Ear: External ear normal.     Nose: Nose normal.     Mouth/Throat:     Mouth: Mucous membranes are moist.  Eyes:     Conjunctiva/sclera: Conjunctivae normal.  Cardiovascular:     Rate and Rhythm: Normal rate.  Pulmonary:     Effort: Pulmonary effort is normal. No respiratory distress.  Musculoskeletal:        General: Tenderness and signs of injury present. No swelling or deformity. Normal range of motion.     Left knee: No swelling or deformity. Normal range of motion. Tenderness present over the medial joint line.       Legs:  Skin:  General: Skin is warm and dry.  Neurological:     General: No focal deficit present.     Mental Status: He is  alert.  Psychiatric:        Mood and Affect: Mood normal.      UC Treatments / Results  Labs (all labs ordered are listed, but only abnormal results are displayed) Labs Reviewed - No data to display  EKG   Radiology DG Knee Complete 4 Views Left Result Date: 06/14/2023 CLINICAL DATA:  Medial knee pain, fall EXAM: LEFT KNEE - COMPLETE 4+ VIEW COMPARISON:  None Available. FINDINGS: No evidence of fracture or dislocation. Small knee joint effusion. No evidence of arthropathy or other focal bone abnormality. Soft tissues are unremarkable. IMPRESSION: Small knee joint effusion without acute fracture or dislocation. Electronically Signed   By: Duanne Guess D.O.   On: 06/14/2023 12:07    Procedures Procedures (including critical care time)  Medications Ordered in UC Medications  acetaminophen (TYLENOL) tablet 650 mg (650 mg Oral Given 06/14/23 1151)    Initial Impression / Assessment and Plan / UC Course  I have reviewed the triage vital signs and the nursing notes.  Pertinent labs & imaging results that were available during my care of the patient were reviewed by me and considered in my medical decision making (see chart for details).  Vitals and triage reviewed, patient seen and placed stable.  Left medial knee tenderness to palpation.  No obvious swelling or deformity.  Imaging by my interpretation does not show any acute fractures or bony abnormalities, confirmed by radiology overread.  Suspect knee strain, bone bruise versus meniscus or MCL injury.  Advised follow-up with orthopedic for further evaluation if pain continues.  Pain management reviewed.  Plan of care, follow-up care return precautions given, no questions at this time.     Final Clinical Impressions(s) / UC Diagnoses   Final diagnoses:  Medial knee pain, left     Discharge Instructions      Your imaging did not show any acute fractures or bony abnormalities.  Please rest, ice, compress and elevate your  knee.  Use the brace to provide compression.  You can take 600 mg of ibuprofen every 8 hours to help with pain and swelling.  If your knee pain does not improve, please follow-up with orthopedic for they can consider advanced imaging and further testing.  Return to clinic for any new or urgent symptoms.     ED Prescriptions   None    PDMP not reviewed this encounter.   Ceasia Elwell, [place], [place] 06/14/23 1212

## 2023-06-14 NOTE — Discharge Instructions (Signed)
 Your imaging did not show any acute fractures or bony abnormalities.  Please rest, ice, compress and elevate your knee.  Use the brace to provide compression.  You can take 600 mg of ibuprofen every 8 hours to help with pain and swelling.  If your knee pain does not improve, please follow-up with orthopedic for they can consider advanced imaging and further testing.  Return to clinic for any new or urgent symptoms.

## 2023-06-14 NOTE — ED Triage Notes (Addendum)
 Pt c/o left knee pain for 1 day. States he was taking his dogs out yesterday and stepped wrong which led to him  twisting his knee. Pt did not hit knee.

## 2023-06-17 DIAGNOSIS — S8002XA Contusion of left knee, initial encounter: Secondary | ICD-10-CM | POA: Diagnosis not present

## 2023-07-15 DIAGNOSIS — S8002XD Contusion of left knee, subsequent encounter: Secondary | ICD-10-CM | POA: Diagnosis not present

## 2024-01-09 ENCOUNTER — Other Ambulatory Visit: Payer: Self-pay | Admitting: Internal Medicine

## 2024-01-09 DIAGNOSIS — E7849 Other hyperlipidemia: Secondary | ICD-10-CM

## 2024-01-15 ENCOUNTER — Encounter: Payer: Self-pay | Admitting: Internal Medicine

## 2024-01-15 ENCOUNTER — Ambulatory Visit: Payer: Self-pay | Admitting: Internal Medicine

## 2024-01-15 ENCOUNTER — Ambulatory Visit: Payer: BC Managed Care – PPO | Admitting: Internal Medicine

## 2024-01-15 VITALS — BP 130/80 | HR 96 | Temp 98.4°F | Ht 71.0 in | Wt 245.0 lb

## 2024-01-15 DIAGNOSIS — R972 Elevated prostate specific antigen [PSA]: Secondary | ICD-10-CM

## 2024-01-15 DIAGNOSIS — N400 Enlarged prostate without lower urinary tract symptoms: Secondary | ICD-10-CM | POA: Insufficient documentation

## 2024-01-15 DIAGNOSIS — Z0000 Encounter for general adult medical examination without abnormal findings: Secondary | ICD-10-CM | POA: Diagnosis not present

## 2024-01-15 DIAGNOSIS — R739 Hyperglycemia, unspecified: Secondary | ICD-10-CM

## 2024-01-15 DIAGNOSIS — E785 Hyperlipidemia, unspecified: Secondary | ICD-10-CM | POA: Diagnosis not present

## 2024-01-15 DIAGNOSIS — R030 Elevated blood-pressure reading, without diagnosis of hypertension: Secondary | ICD-10-CM

## 2024-01-15 DIAGNOSIS — E538 Deficiency of other specified B group vitamins: Secondary | ICD-10-CM

## 2024-01-15 DIAGNOSIS — E66811 Obesity, class 1: Secondary | ICD-10-CM | POA: Diagnosis not present

## 2024-01-15 DIAGNOSIS — E559 Vitamin D deficiency, unspecified: Secondary | ICD-10-CM | POA: Diagnosis not present

## 2024-01-15 DIAGNOSIS — R350 Frequency of micturition: Secondary | ICD-10-CM

## 2024-01-15 DIAGNOSIS — Z0001 Encounter for general adult medical examination with abnormal findings: Secondary | ICD-10-CM

## 2024-01-15 DIAGNOSIS — N401 Enlarged prostate with lower urinary tract symptoms: Secondary | ICD-10-CM

## 2024-01-15 DIAGNOSIS — E7849 Other hyperlipidemia: Secondary | ICD-10-CM

## 2024-01-15 LAB — BASIC METABOLIC PANEL WITH GFR
BUN: 13 mg/dL (ref 6–23)
CO2: 30 meq/L (ref 19–32)
Calcium: 9.6 mg/dL (ref 8.4–10.5)
Chloride: 104 meq/L (ref 96–112)
Creatinine, Ser: 0.96 mg/dL (ref 0.40–1.50)
GFR: 88.96 mL/min (ref >60.00)
Glucose, Bld: 126 mg/dL — ABNORMAL HIGH (ref 70–99)
Potassium: 5.1 meq/L (ref 3.5–5.1)
Sodium: 140 meq/L (ref 135–145)

## 2024-01-15 LAB — URINALYSIS, ROUTINE W REFLEX MICROSCOPIC
Bilirubin Urine: NEGATIVE
Hgb urine dipstick: NEGATIVE
Ketones, ur: NEGATIVE
Leukocytes,Ua: NEGATIVE
Nitrite: NEGATIVE
Specific Gravity, Urine: >=1.03 — AB (ref 1.000–1.030)
Total Protein, Urine: NEGATIVE
Urine Glucose: NEGATIVE
Urobilinogen, UA: 0.2 (ref 0.0–1.0)
pH: 5.5 (ref 5.0–8.0)

## 2024-01-15 LAB — CBC WITH DIFFERENTIAL/PLATELET
Basophils Absolute: 0 K/uL (ref 0.0–0.1)
Basophils Relative: 0.5 % (ref 0.0–3.0)
Eosinophils Absolute: 0.2 K/uL (ref 0.0–0.7)
Eosinophils Relative: 2.8 % (ref 0.0–5.0)
HCT: 44.9 % (ref 39.0–52.0)
Hemoglobin: 15.1 g/dL (ref 13.0–17.0)
Lymphocytes Relative: 24.2 % (ref 12.0–46.0)
Lymphs Abs: 1.3 K/uL (ref 0.7–4.0)
MCHC: 33.7 g/dL (ref 30.0–36.0)
MCV: 84.9 fl (ref 78.0–100.0)
Monocytes Absolute: 0.5 K/uL (ref 0.1–1.0)
Monocytes Relative: 8.5 % (ref 3.0–12.0)
Neutro Abs: 3.6 K/uL (ref 1.4–7.7)
Neutrophils Relative %: 64 % (ref 43.0–77.0)
Platelets: 205 K/uL (ref 150.0–400.0)
RBC: 5.29 Mil/uL (ref 4.22–5.81)
RDW: 13.8 % (ref 11.5–15.5)
WBC: 5.6 K/uL (ref 4.0–10.5)

## 2024-01-15 LAB — LIPID PANEL
Cholesterol: 182 mg/dL (ref 0–200)
HDL: 49.4 mg/dL (ref >39.00)
LDL Cholesterol: 113 mg/dL — ABNORMAL HIGH (ref 0–99)
NonHDL: 132.92
Total CHOL/HDL Ratio: 4
Triglycerides: 101 mg/dL (ref 0.0–149.0)
VLDL: 20.2 mg/dL (ref 0.0–40.0)

## 2024-01-15 LAB — HEMOGLOBIN A1C: Hgb A1c MFr Bld: 6.7 % — ABNORMAL HIGH (ref 4.6–6.5)

## 2024-01-15 LAB — HEPATIC FUNCTION PANEL
ALT: 22 U/L (ref 0–53)
AST: 19 U/L (ref 0–37)
Albumin: 4.5 g/dL (ref 3.5–5.2)
Alkaline Phosphatase: 76 U/L (ref 39–117)
Bilirubin, Direct: 0.1 mg/dL (ref 0.0–0.3)
Total Bilirubin: 0.5 mg/dL (ref 0.2–1.2)
Total Protein: 7.2 g/dL (ref 6.0–8.3)

## 2024-01-15 LAB — VITAMIN D 25 HYDROXY (VIT D DEFICIENCY, FRACTURES): VITD: 26.31 ng/mL — ABNORMAL LOW (ref 30.00–100.00)

## 2024-01-15 LAB — VITAMIN B12: Vitamin B-12: 305 pg/mL (ref 211–911)

## 2024-01-15 LAB — TSH: TSH: 3.12 u[IU]/mL (ref 0.35–5.50)

## 2024-01-15 LAB — PSA: PSA: 2.53 ng/mL (ref 0.10–4.00)

## 2024-01-15 MED ORDER — PRAVASTATIN SODIUM 40 MG PO TABS: 40.0000 mg | ORAL_TABLET | Freq: Every day | ORAL | 3 refills | Status: AC

## 2024-01-15 MED ORDER — TADALAFIL 20 MG PO TABS: ORAL_TABLET | ORAL | 11 refills | Status: AC

## 2024-01-15 MED ORDER — SILDENAFIL CITRATE 100 MG PO TABS: 50.0000 mg | ORAL_TABLET | Freq: Every day | ORAL | 11 refills | Status: AC | PRN

## 2024-01-15 MED ORDER — TAMSULOSIN HCL 0.4 MG PO CAPS: 0.4000 mg | ORAL_CAPSULE | Freq: Every day | ORAL | 3 refills | Status: DC

## 2024-01-15 MED ORDER — PHENTERMINE HCL 30 MG PO CAPS: 30.0000 mg | ORAL_CAPSULE | ORAL | 1 refills | Status: DC

## 2024-01-15 MED ORDER — TOPIRAMATE 50 MG PO TABS: 50.0000 mg | ORAL_TABLET | Freq: Two times a day (BID) | ORAL | 1 refills | Status: DC

## 2024-01-15 NOTE — Assessment & Plan Note (Signed)
 Lab Results  Component Value Date   LDLCALC 105 (H) 01/09/2023   Mild uncontrolled, has been working on lower chol diet and cont pravachol  40 qd

## 2024-01-15 NOTE — Patient Instructions (Addendum)
 Ok to hold on the B12 tablets until the lab today  Please continue all other medications as before, and refills have been done as requested  Please have the pharmacy call with any other refills you may need.  Please continue your efforts at being more active, low cholesterol diet, and weight control.  You are otherwise up to date with prevention measures today.  Please keep your appointments with your specialists as you may have planned  Please go to the LAB at the blood drawing area for the tests to be done  You will be contacted by phone if any changes need to be made immediately.  Otherwise, you will receive a letter about your results with an explanation, but please check with MyChart first.  Please make an Appointment to return for your 1 year visit, or sooner if needed, with Lab testing by Appointment as well, to be done about 3-5 days before at the FIRST FLOOR Lab (so this is for TWO appointments - please see the scheduling desk as you leave)

## 2024-01-15 NOTE — Assessment & Plan Note (Signed)
 Pt finds significant improved, for cont'd flomax , also for psa with labs

## 2024-01-15 NOTE — Assessment & Plan Note (Signed)
 Now worsening again, for restart topiramate  / phentermine  that worked so well last yr

## 2024-01-15 NOTE — Assessment & Plan Note (Signed)
Lab Results  Component Value Date   HGBA1C 6.2 01/09/2023   Stable, pt to continue current medical treatment  - diet, wt control

## 2024-01-15 NOTE — Assessment & Plan Note (Signed)
 Age and sex appropriate education and counseling updated with regular exercise and diet Referrals for preventative services - none needed Immunizations addressed - declines all (covid, tdap, prevnar, hep b) Smoking counseling  - none needed Evidence for depression or other mood disorder - none significant Most recent labs reviewed. I have personally reviewed and have noted: 1) the patient's medical and social history 2) The patient's current medications and supplements 3) The patient's height, weight, and BMI have been recorded in the chart

## 2024-01-15 NOTE — Progress Notes (Signed)
 Patient ID: David Schneider, male   DOB: 12/27/1970, 53 y.o.   MRN: [id number]         Chief Complaint:: wellness exam and obesity, ED, BPH, hyperglycemia, hld, elev BP       HPI:  David Schneider is a 53 y.o. male here for wellness exam; declines all immunizations; o/w up to date                        Also flomax  working well with urinary stream and no nocturia, but still has some mild urinary frequency, worse when drinks beer (not ofter).  Pt denies chest pain, increased sob or doe, wheezing, orthopnea, PND, increased LE swelling, palpitations, dizziness or syncope.   Pt denies polydipsia, polyuria, or new focal neuro s/s.    Pt denies fever, wt loss, night sweats, loss of appetite, or other constitutional symptoms  Asks to restart phentermine /topiramate  as worked well with wt loss to 216, but then wt increased off the med.  Otherwise tolerated well.  Does not qualify for GLP1.  Had taken oral b12 for over 6 month, none recent, now for f/u lab.  Still trying to work on lower chol diet, did have Cardiac CT score 0 in 2023, declines statin.  BP has been ok at home  Wt Readings from Last 3 Encounters:  01/15/24 245 lb (111.1 kg)  01/09/23 224 lb (101.6 kg)  11/06/22 219 lb 6.4 oz (99.5 kg)   BP Readings from Last 3 Encounters:  01/15/24 130/80  06/14/23 135/76  01/09/23 120/72   Immunization History  Administered Date(s) Administered   PFIZER(Purple Top)SARS-COV-2 Vaccination 05/30/2019, 06/20/2019, 03/30/2020   Td 09/13/2009   Zoster Recombinant(Shingrix) 01/11/2022, 04/18/2022   There are no preventive care reminders to display for this patient.     Past Medical History:  Diagnosis Date   Allergic rhinitis    seasonal   Allergy    ED (erectile dysfunction)    Hyperlipidemia    Past Surgical History:  Procedure Laterality Date   TONSILLECTOMY     WISDOM TOOTH EXTRACTION      reports that he has never smoked. He has never used smokeless tobacco. He reports current alcohol  use of about 6.0 standard drinks of alcohol per week. He reports that he does not use drugs. family history includes Diabetes in his brother, maternal grandmother, and mother; Esophageal cancer in his maternal grandfather; Heart attack in his maternal grandmother and mother; Hyperlipidemia in his father and mother; Hypertension in his father; Throat cancer in his maternal grandfather; Transient ischemic attack in his mother. Allergies  Allergen Reactions   Transderm-Scop [Scopolamine ]     confusion   Fexofenadine-Pseudoephed Er     REACTION: headache   Current Outpatient Medications on File Prior to Visit  Medication Sig Dispense Refill   Cyanocobalamin  (VITAMIN B-12) 1000 MCG SUBL PLACE ONE TABLET UNDER THE TONGUE DAILY (Patient not taking: Reported on 01/15/2024) 100 tablet 3   No current facility-administered medications on file prior to visit.        ROS:  All others reviewed and negative.  Objective        PE:  BP 130/80 (BP Location: Left Arm, Patient Position: Sitting, Cuff Size: Normal)   Pulse 96   Temp 98.4 F (36.9 C) (Oral)   Ht 5' 11 (1.803 m)   Wt 245 lb (111.1 kg)   SpO2 98%   BMI 34.17 kg/m  Constitutional: Pt appears in NAD               HENT: Head: NCAT.                Right Ear: External ear normal.                 Left Ear: External ear normal.                Eyes: . Pupils are equal, round, and reactive to light. Conjunctivae and EOM are normal               Nose: without d/c or deformity               Neck: Neck supple. Gross normal ROM               Cardiovascular: Normal rate and regular rhythm.                 Pulmonary/Chest: Effort normal and breath sounds without rales or wheezing.                Abd:  Soft, NT, ND, + BS, no organomegaly               Neurological: Pt is alert. At baseline orientation, motor grossly intact               Skin: Skin is warm. No rashes, no other new lesions, LE edema - none               Psychiatric:  Pt behavior is normal without agitation   Micro: none  Cardiac tracings I have personally interpreted today:  none  Pertinent Radiological findings (summarize): none   Lab Results  Component Value Date   WBC 5.2 01/09/2023   HGB 15.2 01/09/2023   HCT 46.1 01/09/2023   PLT 220.0 01/09/2023   GLUCOSE 116 (H) 01/09/2023   CHOL 171 01/09/2023   TRIG 87.0 01/09/2023   HDL 49.30 01/09/2023   LDLDIRECT 157.4 06/23/2007   LDLCALC 105 (H) 01/09/2023   ALT 18 01/09/2023   AST 17 01/09/2023   NA 140 01/09/2023   K 4.3 01/09/2023   CL 103 01/09/2023   CREATININE 0.88 01/09/2023   BUN 12 01/09/2023   CO2 28 01/09/2023   TSH 2.52 01/09/2023   PSA 2.88 01/09/2023   HGBA1C 6.2 01/09/2023   Assessment/Plan:  David Schneider is a 53 y.o. White or Caucasian [1] male with  has a past medical history of Allergic rhinitis, Allergy, ED (erectile dysfunction), and Hyperlipidemia.  Encounter for well adult exam with abnormal findings Age and sex appropriate education and counseling updated with regular exercise and diet Referrals for preventative services - none needed Immunizations addressed - declines all (covid, tdap, prevnar, hep b) Smoking counseling  - none needed Evidence for depression or other mood disorder - none significant Most recent labs reviewed. I have personally reviewed and have noted: 1) the patient's medical and social history 2) The patient's current medications and supplements 3) The patient's height, weight, and BMI have been recorded in the chart   Obesity (BMI 30.0-34.9) Now worsening again, for restart topiramate  / phentermine  that worked so well last yr  Hyperglycemia Lab Results  Component Value Date   HGBA1C 6.2 01/09/2023   Stable, pt to continue current medical treatment  - diet, wt control   HLD (hyperlipidemia) Lab Results  Component Value Date   LDLCALC 105 (H) 01/09/2023  Mild uncontrolled, has been working on lower chol diet and cont  pravachol  40 qd   BPH (benign prostatic hyperplasia) Pt finds significant improved, for cont'd flomax , also for psa with labs  Blood pressure elevated without history of HTN Borderline elevated today, ok at home, ? Due to regained wt, ok to continue to monitor at home  Followup: Return in about 1 year (around 01/14/2025).  Lynwood Rush, MD 01/15/2024 8:40 AM [facility]

## 2024-01-15 NOTE — Assessment & Plan Note (Signed)
 Borderline elevated today, ok at home, ? Due to regained wt, ok to continue to monitor at home

## 2024-02-18 ENCOUNTER — Encounter: Payer: Self-pay | Admitting: Internal Medicine

## 2024-02-18 DIAGNOSIS — R972 Elevated prostate specific antigen [PSA]: Secondary | ICD-10-CM

## 2024-02-18 DIAGNOSIS — N401 Enlarged prostate with lower urinary tract symptoms: Secondary | ICD-10-CM

## 2024-02-18 MED ORDER — TAMSULOSIN HCL 0.4 MG PO CAPS: 0.4000 mg | ORAL_CAPSULE | Freq: Two times a day (BID) | ORAL | 3 refills | Status: AC

## 2024-04-14 ENCOUNTER — Encounter: Payer: Self-pay | Admitting: Internal Medicine

## 2024-04-14 MED ORDER — PHENTERMINE HCL 37.5 MG PO CAPS: 37.5000 mg | ORAL_CAPSULE | ORAL | 2 refills | Status: AC

## 2025-01-17 ENCOUNTER — Encounter: Admitting: Internal Medicine

## 2025-01-20 ENCOUNTER — Encounter: Admitting: Internal Medicine
